# Patient Record
Sex: Male | Born: 1990 | Race: White | Hispanic: No | Marital: Single | Smoking: Current every day smoker
Health system: Southern US, Community
[De-identification: ages and names within clinical notes are randomized; demographics above are authoritative.]

## PROBLEM LIST (undated history)

## (undated) DIAGNOSIS — E785 Hyperlipidemia, unspecified: Secondary | ICD-10-CM

## (undated) DIAGNOSIS — I10 Essential (primary) hypertension: Secondary | ICD-10-CM

## (undated) HISTORY — PX: WISDOM TOOTH EXTRACTION: SHX21

---
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
## 2015-02-08 ENCOUNTER — Emergency Department (HOSPITAL_BASED_OUTPATIENT_CLINIC_OR_DEPARTMENT_OTHER): Payer: BLUE CROSS/BLUE SHIELD

## 2015-02-08 ENCOUNTER — Encounter (HOSPITAL_BASED_OUTPATIENT_CLINIC_OR_DEPARTMENT_OTHER): Payer: Self-pay | Admitting: *Deleted

## 2015-02-08 ENCOUNTER — Emergency Department (HOSPITAL_BASED_OUTPATIENT_CLINIC_OR_DEPARTMENT_OTHER)
Admission: EM | Admit: 2015-02-08 | Discharge: 2015-02-08 | Disposition: A | Discharge status: Home / Self Care | Payer: BLUE CROSS/BLUE SHIELD | Attending: Emergency Medicine | Admitting: Emergency Medicine

## 2015-02-08 DIAGNOSIS — Y9289 Other specified places as the place of occurrence of the external cause: Secondary | ICD-10-CM | POA: Diagnosis not present

## 2015-02-08 DIAGNOSIS — S62610A Displaced fracture of proximal phalanx of right index finger, initial encounter for closed fracture: Secondary | ICD-10-CM | POA: Insufficient documentation

## 2015-02-08 DIAGNOSIS — S62600A Fracture of unspecified phalanx of right index finger, initial encounter for closed fracture: Secondary | ICD-10-CM

## 2015-02-08 DIAGNOSIS — Z720 Tobacco use: Secondary | ICD-10-CM | POA: Insufficient documentation

## 2015-02-08 DIAGNOSIS — Y998 Other external cause status: Secondary | ICD-10-CM | POA: Diagnosis not present

## 2015-02-08 DIAGNOSIS — W228XXA Striking against or struck by other objects, initial encounter: Secondary | ICD-10-CM | POA: Diagnosis not present

## 2015-02-08 DIAGNOSIS — Y9389 Activity, other specified: Secondary | ICD-10-CM | POA: Insufficient documentation

## 2015-02-08 DIAGNOSIS — S6991XA Unspecified injury of right wrist, hand and finger(s), initial encounter: Secondary | ICD-10-CM | POA: Diagnosis present

## 2015-02-08 MED ORDER — HYDROCODONE-ACETAMINOPHEN 5-325 MG PO TABS
2.0000 | ORAL_TABLET | Freq: Once | ORAL | Status: AC
  Administered 2015-02-08: 2 via ORAL
  Filled 2015-02-08: qty 2

## 2015-02-08 MED ORDER — HYDROCODONE-ACETAMINOPHEN 5-325 MG PO TABS: 1.0000 | ORAL_TABLET | Freq: Four times a day (QID) | ORAL | Status: DC | PRN

## 2015-02-08 NOTE — ED Provider Notes (Signed)
CSN: [id number]     Arrival date & time 02/08/15  1307 History   First MD Initiated Contact with Patient 02/08/15 1317     Chief Complaint  Patient presents with  . Hand Pain     (Consider location/radiation/quality/duration/timing/severity/associated sxs/prior Treatment) HPI Complains of pain at right hand predominantly index finger after he punched a sofa last night. No other injury. No treatment prior to coming here. Pain worse with moving his index finger. Improved with remaining still no other associated symptoms. History reviewed. No pertinent past medical history. Past Surgical History  Procedure Laterality Date  . Wisdom tooth extraction     No family history on file. History  Substance Use Topics  . Smoking status: Current Every Day Smoker  . Smokeless tobacco: Not on file  . Alcohol Use: Yes    Review of Systems  Constitutional: Negative.   Musculoskeletal: Positive for arthralgias.       Right hand pain      Allergies  Review of patient's allergies indicates no known allergies.  Home Medications   Prior to Admission medications   Not on File   BP 136/89 mmHg  Pulse 93  Temp(Src) 98.2 F (36.8 C) (Oral)  Resp 16  Ht 6\' 1"  (1.854 m)  Wt 205 lb (92.987 kg)  BMI 27.05 kg/m2  SpO2 100% Physical Exam  Constitutional: He appears well-developed and well-nourished. No distress.  HENT:  Head: Normocephalic and atraumatic.  Right Ear: External ear normal.  Left Ear: External ear normal.  Eyes: EOM are normal.  Neck: Neck supple.  Cardiovascular: Normal rate.   Pulmonary/Chest: Effort normal.  Abdominal: He exhibits no distension.  Musculoskeletal:  Right upper extremity skin intact. Mildly swollen and tender at index finger proximal phalanx. Palmer surface of hand ecchymotic immediatelyproximal2 index finger. Full range of motion. Good capillary refill. All other extremities or contusion abrasion or tenderness neurovascularly intact  Nursing note and  vitals reviewed.   ED Course  Procedures (including critical care time) Labs Review Labs Reviewed - No data to display  Imaging Review Dg Hand Complete Right  02/08/2015   CLINICAL DATA:  Right hand pain and tenderness, trauma yesterday  EXAM: RIGHT HAND - COMPLETE 3+ VIEW  COMPARISON:  None.  FINDINGS: There is a transverse fracture at the base of the right second digit proximal phalanx. No radiopaque foreign body. Soft tissues are unremarkable.  IMPRESSION: Transverse fracture at the base of the proximal phalanx of the right second digit.   Electronically Signed   By: Christiana Pellant M.D.   On: 02/08/2015 13:34     EKG Interpretation None     X-ray viewed by me No results found for this or any previous visit. Dg Hand Complete Right  02/08/2015   CLINICAL DATA:  Right hand pain and tenderness, trauma yesterday  EXAM: RIGHT HAND - COMPLETE 3+ VIEW  COMPARISON:  None.  FINDINGS: There is a transverse fracture at the base of the right second digit proximal phalanx. No radiopaque foreign body. Soft tissues are unremarkable.  IMPRESSION: Transverse fracture at the base of the proximal phalanx of the right second digit.   Electronically Signed   By: Christiana Pellant M.D.   On: 02/08/2015 13:34    Splint applied by nurse and modified by me. Patient comfortable after splint applied and treated with Norco MDM  Plan prescription Norco. Referral Dr. Mina Marble Diagnosis closed fracture of right index finger Final diagnoses:  None        Johnita Palleschi  Ethelda Chick, MD 02/08/15 [phone]

## 2015-02-08 NOTE — Discharge Instructions (Signed)
Cast or Splint Care Take Tylenol or Advil for mild pain or the pain medicine prescribed for bad pain. Don't take Tylenol or drink alcohol together with the pain medicine prescribed as the combination be dangerous.call Dr. Ronie Spies office today or tomorrow to schedule an office appointment for within the next week. Casts and splints support injured limbs and keep bones from moving while they heal.  HOME CARE  Keep the cast or splint uncovered during the drying period.  A plaster cast can take 24 to 48 hours to dry.  A fiberglass cast will dry in less than 1 hour.  Do not rest the cast on anything harder than a pillow for 24 hours.  Do not put weight on your injured limb. Do not put pressure on the cast. Wait for your doctor's approval.  Keep the cast or splint dry.  Cover the cast or splint with a plastic bag during baths or wet weather.  If you have a cast over your chest and belly (trunk), take sponge baths until the cast is taken off.  If your cast gets wet, dry it with a towel or blow dryer. Use the cool setting on the blow dryer.  Keep your cast or splint clean. Wash a dirty cast with a damp cloth.  Do not put any objects under your cast or splint.  Do not scratch the skin under the cast with an object. If itching is a problem, use a blow dryer on a cool setting over the itchy area.  Do not trim or cut your cast.  Do not take out the padding from inside your cast.  Exercise your joints near the cast as told by your doctor.  Raise (elevate) your injured limb on 1 or 2 pillows for the first 1 to 3 days. GET HELP IF:  Your cast or splint cracks.  Your cast or splint is too tight or too loose.  You itch badly under the cast.  Your cast gets wet or has a soft spot.  You have a bad smell coming from the cast.  You get an object stuck under the cast.  Your skin around the cast becomes red or sore.  You have new or more pain after the cast is put on. GET HELP RIGHT  AWAY IF:  You have fluid leaking through the cast.  You cannot move your fingers or toes.  Your fingers or toes turn blue or white or are cool, painful, or puffy (swollen).  You have tingling or lose feeling (numbness) around the injured area.  You have bad pain or pressure under the cast.  You have trouble breathing or have shortness of breath.  You have chest pain. Document Released: 11/02/2010 Document Revised: 03/05/2013 Document Reviewed: 01/09/2013 [facility] Patient Information 2015 [place], [place]. This information is not intended to replace advice given to you by your health care provider. Make sure you discuss any questions you have with your health care provider.

## 2015-02-08 NOTE — ED Notes (Signed)
"  punched" piece of furniture, wooden support portion, incident occurred last PM, pain indicated at Right Index finger, swelling noted to rt hand area also noted

## 2015-02-08 NOTE — ED Notes (Signed)
Pt punched a couch last night with his right hand. Hand is now swollen and painful.

## 2015-02-08 NOTE — ED Notes (Signed)
Ice pack and elevation implemented for comfort measures 

## 2016-06-12 DIAGNOSIS — B2790 Infectious mononucleosis, unspecified without complication: Secondary | ICD-10-CM | POA: Diagnosis not present

## 2016-06-12 DIAGNOSIS — J029 Acute pharyngitis, unspecified: Secondary | ICD-10-CM | POA: Diagnosis not present

## 2016-11-16 DIAGNOSIS — J029 Acute pharyngitis, unspecified: Secondary | ICD-10-CM | POA: Diagnosis not present

## 2017-03-14 DIAGNOSIS — Z713 Dietary counseling and surveillance: Secondary | ICD-10-CM | POA: Diagnosis not present

## 2018-09-24 ENCOUNTER — Encounter (HOSPITAL_BASED_OUTPATIENT_CLINIC_OR_DEPARTMENT_OTHER): Payer: Self-pay | Admitting: *Deleted

## 2018-09-24 ENCOUNTER — Other Ambulatory Visit: Payer: Self-pay

## 2018-09-24 ENCOUNTER — Emergency Department (HOSPITAL_BASED_OUTPATIENT_CLINIC_OR_DEPARTMENT_OTHER)
Admission: EM | Admit: 2018-09-24 | Discharge: 2018-09-25 | Disposition: A | Discharge status: Home / Self Care | Payer: BLUE CROSS/BLUE SHIELD | Attending: Emergency Medicine | Admitting: Emergency Medicine

## 2018-09-24 DIAGNOSIS — F17200 Nicotine dependence, unspecified, uncomplicated: Secondary | ICD-10-CM | POA: Diagnosis not present

## 2018-09-24 DIAGNOSIS — B9789 Other viral agents as the cause of diseases classified elsewhere: Secondary | ICD-10-CM

## 2018-09-24 DIAGNOSIS — R05 Cough: Secondary | ICD-10-CM | POA: Diagnosis not present

## 2018-09-24 DIAGNOSIS — R509 Fever, unspecified: Secondary | ICD-10-CM | POA: Diagnosis present

## 2018-09-24 DIAGNOSIS — J069 Acute upper respiratory infection, unspecified: Secondary | ICD-10-CM | POA: Insufficient documentation

## 2018-09-24 NOTE — ED Notes (Signed)
ED Provider at bedside. 

## 2018-09-24 NOTE — ED Triage Notes (Signed)
Cough and fever x 2 days. No fever reducer.

## 2018-09-24 NOTE — ED Provider Notes (Signed)
   [facility] Provider Note: Roy Dell, MD, FACEP  CSN: [id number] MRN: [id number] ARRIVAL: 09/24/18 at 2205 ROOM: [facility]2/[facility]2   CHIEF COMPLAINT  Fever and Cough   HISTORY OF PRESENT ILLNESS  09/24/18 11:47 PM Anatoly Dory is a 28 y.o. male with a 2-day history of flulike symptoms.  He has had subjective fever, body aches, nasal congestion, scratchy throat, nonproductive cough and equivocal shortness of breath. He has been taking DayQuil, which was helping his symptoms, but is now out.    History reviewed. No pertinent past medical history.  Past Surgical History:  Procedure Laterality Date  . WISDOM TOOTH EXTRACTION      No family history on file.  Social History   Tobacco Use  . Smoking status: Current Every Day Smoker  . Smokeless tobacco: Never Used  Substance Use Topics  . Alcohol use: Yes  . Drug use: No    Prior to Admission medications   Medication Sig Start Date End Date Taking? Authorizing Provider  HYDROcodone-acetaminophen (NORCO) 5-325 MG per tablet Take 1-2 tablets by mouth every 6 (six) hours as needed. 02/08/15   Doug Sou, MD    Allergies Patient has no known allergies.   REVIEW OF SYSTEMS  Negative except as noted here or in the History of Present Illness.   PHYSICAL EXAMINATION  Initial Vital Signs Blood pressure 137/90, pulse 92, temperature 98.3 F (36.8 C), temperature source Oral, resp. rate 20, height 6\' 1"  (1.854 m), weight 104.3 kg, SpO2 100 %.  Examination General: Well-developed, well-nourished male in no acute distress; appearance consistent with age of record HENT: normocephalic; atraumatic; pharyngeal erythema without exudate; nasal congestion Eyes: pupils equal, round and reactive to light; extraocular muscles intact Neck: supple Heart: regular rate and rhythm Lungs: clear to auscultation bilaterally Abdomen: soft; nondistended; nontender; bowel sounds present Extremities: No deformity; full range of  motion; pulses normal Neurologic: Awake, alert and oriented; motor function intact in all extremities and symmetric; no facial droop Skin: Warm and dry Psychiatric: Normal mood and affect   RESULTS  Summary of this visit's results, reviewed by myself:   EKG Interpretation  Date/Time:    Ventricular Rate:    PR Interval:    QRS Duration:   QT Interval:    QTC Calculation:   R Axis:     Text Interpretation:        Laboratory Studies: No results found for this or any previous visit (from the past 24 hour(s)). Imaging Studies: No results found.  ED COURSE and MDM  Nursing notes and initial vitals signs, including pulse oximetry, reviewed.  Vitals:   09/24/18 2208 09/24/18 2211  BP:  137/90  Pulse:  92  Resp:  20  Temp:  98.3 F (36.8 C)  TempSrc:  Oral  SpO2:  100%  Weight: 104.3 kg   Height: 6\' 1"  (1.854 m)     PROCEDURES    ED DIAGNOSES     ICD-10-CM   1. Viral URI with cough J06.9    B97.89        Tarisha Fader, MD 09/24/18 2358

## 2018-09-24 NOTE — ED Notes (Signed)
Pt reports body aches/fevers/flu symptoms x 2 days

## 2018-09-25 MED ORDER — DM-GUAIFENESIN ER 60-1200 MG PO TB12: ORAL_TABLET | ORAL | 0 refills | Status: AC

## 2018-09-25 MED ORDER — ALBUTEROL SULFATE HFA 108 (90 BASE) MCG/ACT IN AERS: 2.0000 | INHALATION_SPRAY | RESPIRATORY_TRACT | 0 refills | Status: AC | PRN

## 2018-10-09 ENCOUNTER — Emergency Department (HOSPITAL_BASED_OUTPATIENT_CLINIC_OR_DEPARTMENT_OTHER): Payer: BLUE CROSS/BLUE SHIELD

## 2018-10-09 ENCOUNTER — Other Ambulatory Visit: Payer: Self-pay

## 2018-10-09 ENCOUNTER — Emergency Department (HOSPITAL_BASED_OUTPATIENT_CLINIC_OR_DEPARTMENT_OTHER)
Admission: EM | Admit: 2018-10-09 | Discharge: 2018-10-09 | Disposition: A | Discharge status: Home / Self Care | Payer: BLUE CROSS/BLUE SHIELD | Attending: Emergency Medicine | Admitting: Emergency Medicine

## 2018-10-09 ENCOUNTER — Encounter (HOSPITAL_BASED_OUTPATIENT_CLINIC_OR_DEPARTMENT_OTHER): Payer: Self-pay | Admitting: *Deleted

## 2018-10-09 DIAGNOSIS — F17200 Nicotine dependence, unspecified, uncomplicated: Secondary | ICD-10-CM | POA: Diagnosis not present

## 2018-10-09 DIAGNOSIS — R05 Cough: Secondary | ICD-10-CM | POA: Insufficient documentation

## 2018-10-09 DIAGNOSIS — R059 Cough, unspecified: Secondary | ICD-10-CM

## 2018-10-09 MED ORDER — BENZONATATE 200 MG PO CAPS: 200.0000 mg | ORAL_CAPSULE | Freq: Three times a day (TID) | ORAL | 0 refills | Status: AC | PRN

## 2018-10-09 MED ORDER — PREDNISONE 20 MG PO TABS: 60.0000 mg | ORAL_TABLET | Freq: Every day | ORAL | 0 refills | Status: AC

## 2018-10-09 NOTE — ED Provider Notes (Signed)
[facility] EMERGENCY DEPARTMENT Provider Note   CSN: [id number] Arrival date & time: 10/09/18  1620  History   Chief Complaint Chief Complaint  Patient presents with   Cough    HPI Roy Phillips is a 28 y.o. male with no significant past medical history who presents for evaluation of cough.  Patient states he has had cough x310.  Patient was seen in ED at that time was diagnosed with possible flulike illness.  Patient states majority of his symptoms have resolved however he still had persistent cough since his illness.  Has been using as needed albuterol inhaler as well as NyQuil with mild relief of symptoms.  Patient is concerned that he has pneumonia as he has continued cough.  Denies fever, chills, nasal congestion, rhinorrhea, sore throat, neck pain, neck stiffness, chest pain, shortness of breath, night sweats, hemoptysis, nausea, vomiting, abdominal pain, diarrhea dysuria, lower extremity swelling, erythema or warmth.  Denies any additional aggravating or alleviating factors.  Cough mildly productive with light yellow sputum.  Denies PND or orthopnea. Patient is a tobacco smoker.  History obtained from patient.  No interpreter was used.    HPI  History reviewed. No pertinent past medical history.  There are no active problems to display for this patient.   Past Surgical History:  Procedure Laterality Date   WISDOM TOOTH EXTRACTION          Home Medications    Prior to Admission medications   Medication Sig Start Date End Date Taking? Authorizing Provider  albuterol (PROVENTIL HFA;VENTOLIN HFA) 108 (90 Base) MCG/ACT inhaler Inhale 2 puffs into the lungs every 4 (four) hours as needed for wheezing or shortness of breath. 09/24/18   Molpus, Jonny Ruiz, MD  benzonatate (TESSALON) 200 MG capsule Take 1 capsule (200 mg total) by mouth 3 (three) times daily as needed for up to 7 days for cough. 10/09/18 10/16/18  Sylvio Weatherall A, PA-C  Dextromethorphan-Guaifenesin  60-1200 MG 12hr tablet Take 1 tablet twice daily as needed for cough. 09/24/18   Molpus, John, MD  predniSONE (DELTASONE) 20 MG tablet Take 3 tablets (60 mg total) by mouth daily for 5 days. 10/09/18 10/14/18  Jheri Mitter A, PA-C    Family History History reviewed. No pertinent family history.  Social History Social History   Tobacco Use   Smoking status: Current Every Day Smoker   Smokeless tobacco: Never Used  Substance Use Topics   Alcohol use: Yes   Drug use: No     Allergies   Patient has no known allergies.   Review of Systems Review of Systems  Constitutional: Negative.   HENT: Negative.   Eyes: Negative.   Respiratory: Positive for cough. Negative for apnea, choking, chest tightness, shortness of breath, wheezing and stridor.   Cardiovascular: Negative.   Gastrointestinal: Negative.   Genitourinary: Negative.   Musculoskeletal: Negative.   Skin: Negative.   Neurological: Negative.   All other systems reviewed and are negative.    Physical Exam Updated Vital Signs BP (!) 158/102    Pulse 98    Temp 98.6 F (37 C) (Oral)    Resp 18    Ht  (1.854 m)    Wt 104 kg    SpO2 99%    BMI 30.25 kg/m   Physical Exam Vitals signs and nursing note reviewed.  Constitutional:      General: He is not in acute distress.    Appearance: He is not ill-appearing, toxic-appearing or diaphoretic.  HENT:  Head: Normocephalic and atraumatic.     Jaw: There is normal jaw occlusion.     Right Ear: Tympanic membrane, ear canal and external ear normal. There is no impacted cerumen. No hemotympanum. Tympanic membrane is not injected, scarred, perforated, erythematous, retracted or bulging.     Left Ear: Tympanic membrane, ear canal and external ear normal. There is no impacted cerumen. No hemotympanum. Tympanic membrane is not injected, scarred, perforated, erythematous, retracted or bulging.     Ears:     Comments: No Mastoid tenderness.    Nose: Rhinorrhea present.  No congestion.     Comments: Clear rhinorrhea and congestion to bilateral nares.  No sinus tenderness.    Mouth/Throat:     Mouth: Mucous membranes are moist.     Pharynx: Oropharynx is clear.     Comments: Posterior oropharynx clear.  Mucous membranes moist.  Tonsils without erythema or exudate.  Uvula midline without deviation.  No evidence of PTA or RPA.  No drooling, dysphasia or trismus.  Phonation normal. Neck:     Trachea: Trachea and phonation normal.     Meningeal: Brudzinski's sign and Kernig's sign absent.     Comments: No Neck stiffness or neck rigidity.  No meningismus.  No cervical lymphadenopathy. Cardiovascular:     Comments: No murmurs rubs or gallops. Pulmonary:     Effort: Pulmonary effort is normal. No respiratory distress.     Breath sounds: Normal breath sounds. No stridor. No wheezing, rhonchi or rales.     Comments: Clear to auscultation bilaterally without wheeze, rhonchi or rales.  No accessory muscle usage.  Able speak in full sentences. Chest:     Chest wall: No tenderness.  Abdominal:     Comments: Soft, nontender without rebound or guarding.  No CVA tenderness.  Musculoskeletal:     Comments: Moves all 4 extremities without difficulty.  Lower extremities without edema, erythema or warmth.  Skin:    Comments: Brisk capillary refill.  No rashes or lesions.  No lower extremity edema, erythema or warmth.  Neurological:     Mental Status: He is alert.     Comments: Ambulatory in department without difficulty.  Cranial nerves II through XII grossly intact.  No facial droop.  No aphasia.    ED Treatments / Results  Labs (all labs ordered are listed, but only abnormal results are displayed) Labs Reviewed - No data to display  EKG None  Radiology Dg Chest 2 View  Result Date: 10/09/2018 CLINICAL DATA:  Persistent cough. EXAM: CHEST - 2 VIEW COMPARISON:  None. FINDINGS: The heart size and mediastinal contours are within normal limits. Both lungs are clear.  Bilateral nipple shadows. The visualized skeletal structures are unremarkable. IMPRESSION: No active cardiopulmonary disease. Electronically Signed   By: Obie Dredge M.D.   On: 10/09/2018 16:44    Procedures Procedures (including critical care time)  Medications Ordered in ED Medications - No data to display   Initial Impression / Assessment and Plan / ED Course  I have reviewed the triage vital signs and the nursing notes.  Pertinent labs & imaging results that were available during my care of the patient were reviewed by me and considered in my medical decision making (see chart for details).  28 year old male who appears otherwise well presents for evaluation of cough.  Afebrile, nonseptic, non-ill-appearing.  Patient with cough 2 weeks after being diagnosed with flulike symptoms.  Cough mildly productive with light yellow sputum.  Lungs clear to auscultation bilateral without  wheeze, rhonchi or rales.  Able speak in full sentences without difficulty.  No tachypnea, tachycardia or hypoxia.  Chest x-ray personally reviewed.  No evidence of infiltrates, cardiomegaly, pulmonary edema or pneumothorax.  Posterior oropharynx clear.  Able to tolerate p.o. intake without difficulty.  No evidence of PTA or RPA.  Uvula midline deviation.  No neck stiffness or neck rigidity.  No meningismus, low suspicion for meningitis.  Patient states he has not had fever, body aches and pains x2 weeks.  Patient without travel, recent exposure to coronavirus positive patients or TB patients.  Patient without chest pain, shortness of breath, hemoptysis, lower extremity edema, erythema or warmth.  Low suspicion for ACS or PE as cause of cough.  He has no risk factors for TB.  Patient is an everyday tobacco smoker.  Likely post viral cough vs cough from tobacco use.  Will give short course of prednisone, inhaler, Tessalon Perles.  Discussed with patient following up with PCP for reevaluation.  Low suspicion for acute  bacterial illness, PE, pneumothorax, dissection, pulmonary mass, asthma, pneumonia, pertussis, seasonal allergies, CHF, medication induced cough at this time.  Pt will be discharged with symptomatic treatment.  Patient verbalizes understanding and is agreeable with plan. Pt is hemodynamically stable & in NAD prior to dc.     Final Clinical Impressions(s) / ED Diagnoses   Final diagnoses:  Cough    ED Discharge Orders         Ordered    benzonatate (TESSALON) 200 MG capsule  3 times daily PRN     10/09/18 1705    predniSONE (DELTASONE) 20 MG tablet  Daily     10/09/18 1705           Lynda Capistran A, PA-C 10/09/18 1722    Tegeler, Canary Brim, MD 10/09/18 1725

## 2018-10-09 NOTE — ED Notes (Signed)
Patient transported to X-ray 

## 2018-10-09 NOTE — ED Triage Notes (Signed)
Pt c/o cough since 3/10 as was seen here for same , no improvement

## 2018-10-09 NOTE — Discharge Instructions (Signed)
Your evaluated today for cough.  Your chest x-ray was negative.  I have given you a short course of steroids as well as some Tessalon Perles.  Please take as prescribed.  Up with PCP for reevaluation if you continue to have symptoms.  Return to the ED for any worsening symptoms

## 2018-10-09 NOTE — ED Notes (Signed)
ED Provider at bedside. 

## 2018-11-01 ENCOUNTER — Emergency Department (HOSPITAL_BASED_OUTPATIENT_CLINIC_OR_DEPARTMENT_OTHER): Payer: BLUE CROSS/BLUE SHIELD

## 2018-11-01 ENCOUNTER — Encounter (HOSPITAL_BASED_OUTPATIENT_CLINIC_OR_DEPARTMENT_OTHER): Payer: Self-pay

## 2018-11-01 ENCOUNTER — Emergency Department (HOSPITAL_BASED_OUTPATIENT_CLINIC_OR_DEPARTMENT_OTHER)
Admission: EM | Admit: 2018-11-01 | Discharge: 2018-11-01 | Disposition: A | Discharge status: Home / Self Care | Payer: BLUE CROSS/BLUE SHIELD | Attending: Emergency Medicine | Admitting: Emergency Medicine

## 2018-11-01 ENCOUNTER — Other Ambulatory Visit: Payer: Self-pay

## 2018-11-01 DIAGNOSIS — L02215 Cutaneous abscess of perineum: Secondary | ICD-10-CM | POA: Diagnosis not present

## 2018-11-01 DIAGNOSIS — K610 Anal abscess: Secondary | ICD-10-CM | POA: Insufficient documentation

## 2018-11-01 DIAGNOSIS — F17210 Nicotine dependence, cigarettes, uncomplicated: Secondary | ICD-10-CM | POA: Insufficient documentation

## 2018-11-01 DIAGNOSIS — R102 Pelvic and perineal pain: Secondary | ICD-10-CM | POA: Diagnosis not present

## 2018-11-01 DIAGNOSIS — R6883 Chills (without fever): Secondary | ICD-10-CM | POA: Diagnosis not present

## 2018-11-01 DIAGNOSIS — L0291 Cutaneous abscess, unspecified: Secondary | ICD-10-CM

## 2018-11-01 LAB — CBC WITH DIFFERENTIAL/PLATELET
Abs Immature Granulocytes: 0.04 10*3/uL (ref 0.00–0.07)
Basophils Absolute: 0 10*3/uL (ref 0.0–0.1)
Basophils Relative: 0 %
Eosinophils Absolute: 0.2 10*3/uL (ref 0.0–0.5)
Eosinophils Relative: 1 %
HCT: 45.6 % (ref 39.0–52.0)
Hemoglobin: 15.9 g/dL (ref 13.0–17.0)
Immature Granulocytes: 0 %
Lymphocytes Relative: 23 %
Lymphs Abs: 3 10*3/uL (ref 0.7–4.0)
MCH: 30.5 pg (ref 26.0–34.0)
MCHC: 34.9 g/dL (ref 30.0–36.0)
MCV: 87.5 fL (ref 80.0–100.0)
Monocytes Absolute: 1.1 10*3/uL — ABNORMAL HIGH (ref 0.1–1.0)
Monocytes Relative: 8 %
Neutro Abs: 8.9 10*3/uL — ABNORMAL HIGH (ref 1.7–7.7)
Neutrophils Relative %: 68 %
Platelets: 279 10*3/uL (ref 150–400)
RBC: 5.21 MIL/uL (ref 4.22–5.81)
RDW: 12.3 % (ref 11.5–15.5)
WBC: 13.2 10*3/uL — ABNORMAL HIGH (ref 4.0–10.5)
nRBC: 0 % (ref 0.0–0.2)

## 2018-11-01 LAB — LACTIC ACID, PLASMA: Lactic Acid, Venous: 0.9 mmol/L (ref 0.5–1.9)

## 2018-11-01 LAB — COMPREHENSIVE METABOLIC PANEL
ALT: 27 U/L (ref 0–44)
AST: 20 U/L (ref 15–41)
Albumin: 4.1 g/dL (ref 3.5–5.0)
Alkaline Phosphatase: 101 U/L (ref 38–126)
Anion gap: 8 (ref 5–15)
BUN: 14 mg/dL (ref 6–20)
CO2: 23 mmol/L (ref 22–32)
Calcium: 8.7 mg/dL — ABNORMAL LOW (ref 8.9–10.3)
Chloride: 103 mmol/L (ref 98–111)
Creatinine, Ser: 1.29 mg/dL — ABNORMAL HIGH (ref 0.61–1.24)
GFR calc Af Amer: >60 mL/min (ref >60)
GFR calc non Af Amer: >60 mL/min (ref >60)
Glucose, Bld: 109 mg/dL — ABNORMAL HIGH (ref 70–99)
Potassium: 3.4 mmol/L — ABNORMAL LOW (ref 3.5–5.1)
Sodium: 134 mmol/L — ABNORMAL LOW (ref 135–145)
Total Bilirubin: 1.2 mg/dL (ref 0.3–1.2)
Total Protein: 7.9 g/dL (ref 6.5–8.1)

## 2018-11-01 LAB — URINALYSIS, ROUTINE W REFLEX MICROSCOPIC
Bilirubin Urine: NEGATIVE
Glucose, UA: NEGATIVE mg/dL
Hgb urine dipstick: NEGATIVE
Ketones, ur: NEGATIVE mg/dL
Leukocytes,Ua: NEGATIVE
Nitrite: NEGATIVE
Protein, ur: NEGATIVE mg/dL
Specific Gravity, Urine: 1.015 (ref 1.005–1.030)
pH: 7.5 (ref 5.0–8.0)

## 2018-11-01 MED ORDER — LIDOCAINE-EPINEPHRINE (PF) 2 %-1:200000 IJ SOLN
20.0000 mL | Freq: Once | INTRAMUSCULAR | Status: DC
  Filled 2018-11-01: qty 20

## 2018-11-01 MED ORDER — IOHEXOL 300 MG/ML  SOLN
100.0000 mL | Freq: Once | INTRAMUSCULAR | Status: AC | PRN
  Administered 2018-11-01: 100 mL via INTRAVENOUS

## 2018-11-01 MED ORDER — IBUPROFEN 800 MG PO TABS: 800.0000 mg | ORAL_TABLET | Freq: Three times a day (TID) | ORAL | 0 refills | Status: AC

## 2018-11-01 MED ORDER — SODIUM CHLORIDE 0.9 % IV BOLUS
1000.0000 mL | Freq: Once | INTRAVENOUS | Status: AC
  Administered 2018-11-01: 1000 mL via INTRAVENOUS

## 2018-11-01 MED ORDER — MORPHINE SULFATE (PF) 4 MG/ML IV SOLN
4.0000 mg | Freq: Once | INTRAVENOUS | Status: AC
  Administered 2018-11-01: 4 mg via INTRAVENOUS
  Filled 2018-11-01: qty 1

## 2018-11-01 MED ORDER — CEPHALEXIN 500 MG PO CAPS: 500.0000 mg | ORAL_CAPSULE | Freq: Four times a day (QID) | ORAL | 0 refills | Status: AC

## 2018-11-01 MED ORDER — SULFAMETHOXAZOLE-TRIMETHOPRIM 800-160 MG PO TABS: 1.0000 | ORAL_TABLET | Freq: Two times a day (BID) | ORAL | 0 refills | Status: AC

## 2018-11-01 MED ORDER — LIDOCAINE HCL 2 % IJ SOLN: 10.0000 mL | Freq: Once | INTRAMUSCULAR | Status: DC

## 2018-11-01 MED ORDER — ONDANSETRON HCL 4 MG/2ML IJ SOLN
INTRAMUSCULAR | Status: AC
  Filled 2018-11-01: qty 2

## 2018-11-01 MED ORDER — LIDOCAINE HCL 2 % IJ SOLN
20.0000 mL | Freq: Once | INTRAMUSCULAR | Status: DC
  Filled 2018-11-01: qty 20

## 2018-11-01 MED ORDER — TETANUS-DIPHTH-ACELL PERTUSSIS 5-2.5-18.5 LF-MCG/0.5 IM SUSP
0.5000 mL | Freq: Once | INTRAMUSCULAR | Status: AC
  Administered 2018-11-01: 22:00:00 0.5 mL via INTRAMUSCULAR
  Filled 2018-11-01: qty 0.5

## 2018-11-01 MED ORDER — HYDROCODONE-ACETAMINOPHEN 5-325 MG PO TABS: 1.0000 | ORAL_TABLET | Freq: Four times a day (QID) | ORAL | 0 refills | Status: AC | PRN

## 2018-11-01 MED ORDER — PIPERACILLIN-TAZOBACTAM 3.375 G IVPB 30 MIN
3.3750 g | Freq: Once | INTRAVENOUS | Status: AC
  Administered 2018-11-01: 3.375 g via INTRAVENOUS
  Filled 2018-11-01 (×2): qty 50

## 2018-11-01 MED ORDER — LIDOCAINE HCL 2 % IJ SOLN
INTRAMUSCULAR | Status: AC
  Filled 2018-11-01: qty 20

## 2018-11-01 MED ORDER — IBUPROFEN 800 MG PO TABS
800.0000 mg | ORAL_TABLET | Freq: Once | ORAL | Status: AC
  Administered 2018-11-01: 21:00:00 800 mg via ORAL
  Filled 2018-11-01: qty 1

## 2018-11-01 MED ORDER — ONDANSETRON HCL 4 MG/2ML IJ SOLN
4.0000 mg | Freq: Once | INTRAMUSCULAR | Status: AC
  Administered 2018-11-01: 4 mg via INTRAVENOUS

## 2018-11-01 MED ORDER — SODIUM CHLORIDE 0.9% FLUSH
3.0000 mL | Freq: Once | INTRAVENOUS | Status: DC
  Filled 2018-11-01: qty 3

## 2018-11-01 NOTE — ED Provider Notes (Signed)
[facility] EMERGENCY DEPARTMENT Provider Note   CSN: [id number] Arrival date & time: 11/01/18  2012    History   Chief Complaint Chief Complaint  Patient presents with  . Abscess    HPI Roy Phillips is a 28 y.o. male.     The history is provided by the patient and medical records. No language interpreter was used.  Abscess  Associated symptoms: fever    Roy Phillips is a 28 y.o. male  with no known medical problems who presents to the Emergency Department complaining of an area of discomfort to the left side of his buttocks for about 4 days now.  It has been getting progressively worse.  Last night, he developed chills and was unable to sit on the area due to pain.  He did not check his temperature yesterday, but felt as if he had a fever.  Continued to feel worse during the day today.  When he got home from work, he took some NyQuil which contained acetaminophen as he thought he had a high fever.  He then came to the emergency department for further evaluation.  He denies any abdominal pain, nausea or vomiting.  He denies any drainage from the site of concern.  No cough, congestion, sore throat, rhinorrhea/nasal congestion.  No chest pain or shortness of breath.  Denies any history of similar.  History reviewed. No pertinent past medical history.  There are no active problems to display for this patient.   Past Surgical History:  Procedure Laterality Date  . WISDOM TOOTH EXTRACTION          Home Medications    Prior to Admission medications   Medication Sig Start Date End Date Taking? Authorizing Provider  albuterol (PROVENTIL HFA;VENTOLIN HFA) 108 (90 Base) MCG/ACT inhaler Inhale 2 puffs into the lungs every 4 (four) hours as needed for wheezing or shortness of breath. 09/24/18   Molpus, John, MD  cephALEXin (KEFLEX) 500 MG capsule Take 1 capsule (500 mg total) by mouth 4 (four) times daily. 11/01/18   , Chase Picket, PA-C   Dextromethorphan-Guaifenesin 60-1200 MG 12hr tablet Take 1 tablet twice daily as needed for cough. 09/24/18   Molpus, John, MD  HYDROcodone-acetaminophen (NORCO/VICODIN) 5-325 MG tablet Take 1 tablet by mouth every 6 (six) hours as needed for severe pain. 11/01/18   , Chase Picket, PA-C  ibuprofen (ADVIL) 800 MG tablet Take 1 tablet (800 mg total) by mouth 3 (three) times daily. 11/01/18   , Chase Picket, PA-C  sulfamethoxazole-trimethoprim (BACTRIM DS) 800-160 MG tablet Take 1 tablet by mouth 2 (two) times daily for 7 days. 11/01/18 11/08/18  , Chase Picket, PA-C    Family History No family history on file.  Social History Social History   Tobacco Use  . Smoking status: Current Every Day Smoker  . Smokeless tobacco: Never Used  Substance Use Topics  . Alcohol use: Yes  . Drug use: Yes    Types: Marijuana, Cocaine    Comment: last use 10/31/18     Allergies   Patient has no known allergies.   Review of Systems Review of Systems  Constitutional: Positive for chills and fever.  Musculoskeletal: Positive for myalgias (buttocks).  Skin: Positive for wound (Induration, pain).  All other systems reviewed and are negative.    Physical Exam Updated Vital Signs BP 132/78 (BP Location: Left Arm)   Pulse 88   Temp 99.9 F (37.7 C) (Oral)   Resp 20   Ht  (  1.854 m)   Wt 104.3 kg   SpO2 100%   BMI 30.34 kg/m   Physical Exam Vitals signs and nursing note reviewed.  Constitutional:      General: He is not in acute distress.    Appearance: He is well-developed.  HENT:     Head: Normocephalic and atraumatic.  Neck:     Musculoskeletal: Neck supple.  Cardiovascular:     Heart sounds: Normal heart sounds. No murmur.     Comments: Tachycardic but regular. Pulmonary:     Effort: Pulmonary effort is normal. No respiratory distress.     Breath sounds: Normal breath sounds.  Abdominal:     General: There is no distension.     Palpations: Abdomen is soft.      Comments: No abdominal tenderness.  Genitourinary:    Comments: Induration and tenderness to left gluteal cleft and anorectal region. No fluctuance appreciated. No drainage or open wounds. Skin:    General: Skin is warm and dry.  Neurological:     Mental Status: He is alert and oriented to person, place, and time.      ED Treatments / Results  Labs (all labs ordered are listed, but only abnormal results are displayed) Labs Reviewed  COMPREHENSIVE METABOLIC PANEL - Abnormal; Notable for the following components:      Result Value   Sodium 134 (*)    Potassium 3.4 (*)    Glucose, Bld 109 (*)    Creatinine, Ser 1.29 (*)    Calcium 8.7 (*)    All other components within normal limits  CBC WITH DIFFERENTIAL/PLATELET - Abnormal; Notable for the following components:   WBC 13.2 (*)    Neutro Abs 8.9 (*)    Monocytes Absolute 1.1 (*)    All other components within normal limits  CULTURE, BLOOD (ROUTINE X 2)  CULTURE, BLOOD (ROUTINE X 2)  AEROBIC CULTURE (SUPERFICIAL SPECIMEN)  LACTIC ACID, PLASMA  URINALYSIS, ROUTINE W REFLEX MICROSCOPIC  LACTIC ACID, PLASMA    EKG None  Radiology Dg Chest 2 View  Result Date: 11/01/2018 CLINICAL DATA:  Chills, fever EXAM: CHEST - 2 VIEW COMPARISON:  10/09/2018 FINDINGS: The heart size and mediastinal contours are within normal limits. Both lungs are clear. The visualized skeletal structures are unremarkable. IMPRESSION: No active cardiopulmonary disease. Electronically Signed   By: Elige Ko   On: 11/01/2018 21:48   Ct Pelvis W Contrast  Result Date: 11/01/2018 CLINICAL DATA:  Left buttock abscess.  Pain and fever. EXAM: CT PELVIS WITH CONTRAST TECHNIQUE: Multidetector CT imaging of the pelvis was performed using the standard protocol following the bolus administration of intravenous contrast. CONTRAST:  OMNIPAQUE IOHEXOL 300 MG/ML  SOLN COMPARISON:  None. FINDINGS: Urinary Tract:  No abnormality visualized. Bowel:  Unremarkable  visualized pelvic bowel loops. Vascular/Lymphatic: No pathologically enlarged lymph nodes. No significant vascular abnormality seen. Reproductive:  No mass or other significant abnormality Other: 3.4 x 1.8 x 3.4 cm complex fluid collection in the subcutaneous fat in the left aspect of the perineum adjacent to the anus with surrounding inflammatory changes most consistent with an abscess. Musculoskeletal: No acute osseous abnormality. No aggressive osseous lesion. IMPRESSION: 1. 3.4 x 1.8 x 3.4 cm complex fluid collection in the subcutaneous fat in the left aspect of the perineum adjacent to the anus with surrounding inflammatory changes most consistent with an abscess. Electronically Signed   By: Elige Ko   On: 11/01/2018 21:48    Procedures .Marland KitchenIncision and Drainage Date/Time: 11/01/2018 11:04  PM Performed by: , Chase PicketJaime Pilcher, PA-C Authorized by: , Chase PicketJaime Pilcher, PA-C   Consent:    Consent obtained:  Verbal   Consent given by:  Patient   Risks discussed:  Bleeding, incomplete drainage, pain and infection Location:    Type:  Abscess   Size:  3x2x3   Location:  Anogenital   Anogenital location:  Perineum Pre-procedure details:    Skin preparation:  Betadine Anesthesia (see MAR for exact dosages):    Anesthesia method:  Local infiltration   Local anesthetic:  Lidocaine 2% w/o epi Procedure type:    Complexity:  Complex Procedure details:    Incision types:  Cruciate   Incision depth:  Subcutaneous   Scalpel blade:  11   Wound management:  Probed and deloculated   Drainage:  Bloody and purulent   Drainage amount:  Copious   Wound treatment:  Wound left open   Packing materials:  1/4 in gauze Post-procedure details:    Patient tolerance of procedure:  Tolerated well, no immediate complications   (including critical care time)  Medications Ordered in ED Medications  sodium chloride flush (NS) 0.9 % injection 3 mL (3 mLs Intravenous Not Given 11/01/18 2151)  lidocaine  (XYLOCAINE) 2 % (with pres) injection 200 mg (has no administration in time range)  ibuprofen (ADVIL) tablet 800 mg (800 mg Oral Given 11/01/18 2044)  morphine 4 MG/ML injection 4 mg (4 mg Intravenous Given 11/01/18 2056)  ondansetron (ZOFRAN) injection 4 mg (4 mg Intravenous Given 11/01/18 2104)  sodium chloride 0.9 % bolus 1,000 mL ( Intravenous Stopped 11/01/18 2248)  iohexol (OMNIPAQUE) 300 MG/ML solution 100 mL (100 mLs Intravenous Contrast Given 11/01/18 2117)  Tdap (BOOSTRIX) injection 0.5 mL (0.5 mLs Intramuscular Given 11/01/18 2224)  piperacillin-tazobactam (ZOSYN) IVPB 3.375 g ( Intravenous Stopped 11/01/18 2248)     Initial Impression / Assessment and Plan / ED Course  I have reviewed the triage vital signs and the nursing notes.  Pertinent labs & imaging results that were available during my care of the patient were reviewed by me and considered in my medical decision making (see chart for details).       Sheliah Mendsaron T Edmundson is a 28 y.o. male who presents to ED for wound to left buttocks. Initially febrile and mildly tachycardic which improved with antipyretics. Labs reviewed: leukocytosis of 13.2; normal lactic. Blood pressure fine. Blood cx's obtained, but given normal lactic and BP's, doubt sepsis. CT of the pelvis shows 3.4 x 1.8 x 3.4 cm abscess in the left aspect of the perineum adjacent to the anus. I consulted generally surgery, Dr. Luisa Hartornett, and discussed case. He has also independently reviewed imaging. He recommends bedside I&D in the ER with packing, start on ABX and follow up in 1 week in office. I&D was performed by myself with the assistance and guidance of Dr. Silverio LayYao. Packing placed. Recommended that he come back to ED on Sunday for recheck given extensive nature of abscess with fever today. Very low threshold to return sooner if worsens. Will start on Bactrim / Keflex. Symptomatic home care instructions / follow up care / return precautions discussed just prior to discharge and all  questions answered.   Patient seen by and discussed with Dr. Silverio LayYao who agrees with treatment plan.   Final Clinical Impressions(s) / ED Diagnoses   Final diagnoses:  Abscess    ED Discharge Orders         Ordered    sulfamethoxazole-trimethoprim (BACTRIM DS) 800-160 MG tablet  2 times daily     11/01/18 2257    cephALEXin (KEFLEX) 500 MG capsule  4 times daily     11/01/18 2257    HYDROcodone-acetaminophen (NORCO/VICODIN) 5-325 MG tablet  Every 6 hours PRN     11/01/18 2257    ibuprofen (ADVIL) 800 MG tablet  3 times daily     11/01/18 2257           , Chase Picket, PA-C 11/01/18 2318    Charlynne Pander, MD 11/02/18 [phone]

## 2018-11-01 NOTE — ED Notes (Signed)
ED Provider at bedside. 

## 2018-11-01 NOTE — ED Triage Notes (Signed)
Pt reports that he has a sore on his buttock x 4 days. Today pt reports having "chills."

## 2018-11-01 NOTE — Discharge Instructions (Signed)
It was my pleasure taking care of you today!   It is very important that you return to the ER on Sunday for recheck.   Please take all of your antibiotics until finished!   Ibuprofen as needed for fever / mild to moderate pain. Norco only as needed for severe pain.   A stool softener may be helpful to ease pain with bowel movements.   Return to ER sooner if new / worsening symptoms develop or you have any additional concerns.

## 2018-11-01 NOTE — ED Notes (Signed)
Pt also requesting TDaP

## 2018-11-03 ENCOUNTER — Other Ambulatory Visit: Payer: Self-pay

## 2018-11-03 ENCOUNTER — Emergency Department (HOSPITAL_BASED_OUTPATIENT_CLINIC_OR_DEPARTMENT_OTHER)
Admission: EM | Admit: 2018-11-03 | Discharge: 2018-11-03 | Disposition: A | Discharge status: Home / Self Care | Payer: BLUE CROSS/BLUE SHIELD | Attending: Emergency Medicine | Admitting: Emergency Medicine

## 2018-11-03 ENCOUNTER — Encounter (HOSPITAL_BASED_OUTPATIENT_CLINIC_OR_DEPARTMENT_OTHER): Payer: Self-pay | Admitting: Emergency Medicine

## 2018-11-03 DIAGNOSIS — Z79899 Other long term (current) drug therapy: Secondary | ICD-10-CM | POA: Diagnosis not present

## 2018-11-03 DIAGNOSIS — F17200 Nicotine dependence, unspecified, uncomplicated: Secondary | ICD-10-CM | POA: Insufficient documentation

## 2018-11-03 DIAGNOSIS — K611 Rectal abscess: Secondary | ICD-10-CM | POA: Insufficient documentation

## 2018-11-03 DIAGNOSIS — Z4801 Encounter for change or removal of surgical wound dressing: Secondary | ICD-10-CM | POA: Diagnosis present

## 2018-11-03 DIAGNOSIS — Z5189 Encounter for other specified aftercare: Secondary | ICD-10-CM

## 2018-11-03 NOTE — ED Triage Notes (Signed)
Reports to ER for wound recheck to buttock.

## 2018-11-03 NOTE — Discharge Instructions (Signed)
Your wound is healing well. Your wound culture showed bacteria typical from your GI tract. You can stop taking bactrim but please finish taking keflex.   You should do sitz bath 3 times daily for 3-4 days to help the area heal   See your doctor  Return to ER if you have worse pain, swelling, fever.

## 2018-11-03 NOTE — ED Provider Notes (Signed)
[facility] EMERGENCY DEPARTMENT Provider Note   CSN: [id number] Arrival date & time: 11/03/18  1703    History   Chief Complaint Chief Complaint  Patient presents with  . Wound Check    HPI Roy Phillips is a 28 y.o. male here presenting with wound check.  Patient was seen here 2 days ago with a left perirectal abscess on CT scan.  Patient had bedside I&D performed and packing was placed.  Patient also was started on Keflex and Bactrim and was told to come back in 2 days.  He states that he feels fine and has no more fevers.  He has been taking his antibiotics as prescribed.  His wound culture came back to be gram-negative rods.  Patient states that the string began to come out when he took a shower just prior to arrival.      The history is provided by the patient.    History reviewed. No pertinent past medical history.  There are no active problems to display for this patient.   Past Surgical History:  Procedure Laterality Date  . WISDOM TOOTH EXTRACTION          Home Medications    Prior to Admission medications   Medication Sig Start Date End Date Taking? Authorizing Provider  albuterol (PROVENTIL HFA;VENTOLIN HFA) 108 (90 Base) MCG/ACT inhaler Inhale 2 puffs into the lungs every 4 (four) hours as needed for wheezing or shortness of breath. 09/24/18   Molpus, John, MD  cephALEXin (KEFLEX) 500 MG capsule Take 1 capsule (500 mg total) by mouth 4 (four) times daily. 11/01/18   Ward, Chase Picket, PA-C  Dextromethorphan-Guaifenesin 60-1200 MG 12hr tablet Take 1 tablet twice daily as needed for cough. 09/24/18   Molpus, John, MD  HYDROcodone-acetaminophen (NORCO/VICODIN) 5-325 MG tablet Take 1 tablet by mouth every 6 (six) hours as needed for severe pain. 11/01/18   Ward, Chase Picket, PA-C  ibuprofen (ADVIL) 800 MG tablet Take 1 tablet (800 mg total) by mouth 3 (three) times daily. 11/01/18   Ward, Chase Picket, PA-C  sulfamethoxazole-trimethoprim (BACTRIM DS)  800-160 MG tablet Take 1 tablet by mouth 2 (two) times daily for 7 days. 11/01/18 11/08/18  Ward, Chase Picket, PA-C    Family History History reviewed. No pertinent family history.  Social History Social History   Tobacco Use  . Smoking status: Current Every Day Smoker  . Smokeless tobacco: Never Used  Substance Use Topics  . Alcohol use: Yes  . Drug use: Yes    Types: Marijuana, Cocaine    Comment: last use 10/31/18     Allergies   Patient has no known allergies.   Review of Systems Review of Systems  Gastrointestinal: Positive for rectal pain.  All other systems reviewed and are negative.    Physical Exam Updated Vital Signs BP (!) 146/104 (BP Location: Left Arm)   Pulse (!) 103   Temp (!) 97.3 F (36.3 C) (Oral)   Resp 16   Ht 6\' 1"  (1.854 m)   Wt 104.3 kg   SpO2 100%   BMI 30.34 kg/m   Physical Exam Vitals signs and nursing note reviewed.  HENT:     Head: Normocephalic.     Nose: Nose normal.     Mouth/Throat:     Mouth: Mucous membranes are moist.  Eyes:     Extraocular Movements: Extraocular movements intact.     Pupils: Pupils are equal, round, and reactive to light.  Neck:  Musculoskeletal: Normal range of motion.  Cardiovascular:     Rate and Rhythm: Normal rate.  Pulmonary:     Effort: Pulmonary effort is normal.  Abdominal:     General: Abdomen is flat.     Palpations: Abdomen is soft.  Genitourinary:    Comments: Rectal- L perianal abscess that is healing well. Packing removed and there is minimal redness, no purulent drainage  Musculoskeletal: Normal range of motion.  Skin:    General: Skin is warm.  Neurological:     General: No focal deficit present.     Mental Status: He is alert.  Psychiatric:        Mood and Affect: Mood normal.        Behavior: Behavior normal.      ED Treatments / Results  Labs (all labs ordered are listed, but only abnormal results are displayed) Labs Reviewed - No data to display  EKG None   Radiology Dg Chest 2 View  Result Date: 11/01/2018 CLINICAL DATA:  Chills, fever EXAM: CHEST - 2 VIEW COMPARISON:  10/09/2018 FINDINGS: The heart size and mediastinal contours are within normal limits. Both lungs are clear. The visualized skeletal structures are unremarkable. IMPRESSION: No active cardiopulmonary disease. Electronically Signed   By: Elige KoHetal  Patel   On: 11/01/2018 21:48   Ct Pelvis W Contrast  Result Date: 11/01/2018 CLINICAL DATA:  Left buttock abscess.  Pain and fever. EXAM: CT PELVIS WITH CONTRAST TECHNIQUE: Multidetector CT imaging of the pelvis was performed using the standard protocol following the bolus administration of intravenous contrast. CONTRAST:  100mL OMNIPAQUE IOHEXOL 300 MG/ML  SOLN COMPARISON:  None. FINDINGS: Urinary Tract:  No abnormality visualized. Bowel:  Unremarkable visualized pelvic bowel loops. Vascular/Lymphatic: No pathologically enlarged lymph nodes. No significant vascular abnormality seen. Reproductive:  No mass or other significant abnormality Other: 3.4 x 1.8 x 3.4 cm complex fluid collection in the subcutaneous fat in the left aspect of the perineum adjacent to the anus with surrounding inflammatory changes most consistent with an abscess. Musculoskeletal: No acute osseous abnormality. No aggressive osseous lesion. IMPRESSION: 1. 3.4 x 1.8 x 3.4 cm complex fluid collection in the subcutaneous fat in the left aspect of the perineum adjacent to the anus with surrounding inflammatory changes most consistent with an abscess. Electronically Signed   By: Elige KoHetal  Patel   On: 11/01/2018 21:48    Procedures Procedures (including critical care time)  The wound is cleansed, debrided of foreign material as much as possible, and dressed. The patient is alerted to watch for any signs of infection (redness, pus, pain, increased swelling or fever) and call if such occurs. I removed packing. Home wound care instructions are provided.   Medications Ordered in ED  Medications - No data to display   Initial Impression / Assessment and Plan / ED Course  I have reviewed the triage vital signs and the nursing notes.  Pertinent labs & imaging results that were available during my care of the patient were reviewed by me and considered in my medical decision making (see chart for details).       Sheliah Mendsaron T Dang is a 28 y.o. male presenting with wound check.  The left peri-rectal abscess appears well.  The packing was removed and there is no further purulent drainage.  I do not think we need to repacked the wound and since the wound culture showed gram-negative rods, I will discontinue Bactrim.  Patient is stable for discharge and I gave him strict return precautions.  Final Clinical Impressions(s) / ED Diagnoses   Final diagnoses:  Visit for wound check    ED Discharge Orders    None       Charlynne Pander, MD 11/03/18 1735

## 2018-11-04 LAB — AEROBIC CULTURE W GRAM STAIN (SUPERFICIAL SPECIMEN)

## 2018-11-06 LAB — CULTURE, BLOOD (ROUTINE X 2)
Culture: NO GROWTH
Culture: NO GROWTH
Special Requests: ADEQUATE
Special Requests: ADEQUATE

## 2019-07-08 DIAGNOSIS — Z1159 Encounter for screening for other viral diseases: Secondary | ICD-10-CM | POA: Diagnosis not present

## 2021-02-04 ENCOUNTER — Emergency Department (HOSPITAL_BASED_OUTPATIENT_CLINIC_OR_DEPARTMENT_OTHER)
Admission: EM | Admit: 2021-02-04 | Discharge: 2021-02-05 | Disposition: A | Discharge status: Home / Self Care | Payer: BLUE CROSS/BLUE SHIELD | Attending: Emergency Medicine | Admitting: Emergency Medicine

## 2021-02-04 ENCOUNTER — Encounter (HOSPITAL_BASED_OUTPATIENT_CLINIC_OR_DEPARTMENT_OTHER): Payer: Self-pay | Admitting: Emergency Medicine

## 2021-02-04 ENCOUNTER — Other Ambulatory Visit: Payer: Self-pay

## 2021-02-04 DIAGNOSIS — K611 Rectal abscess: Secondary | ICD-10-CM | POA: Insufficient documentation

## 2021-02-04 DIAGNOSIS — D72829 Elevated white blood cell count, unspecified: Secondary | ICD-10-CM | POA: Diagnosis not present

## 2021-02-04 DIAGNOSIS — F17200 Nicotine dependence, unspecified, uncomplicated: Secondary | ICD-10-CM | POA: Diagnosis not present

## 2021-02-04 MED ORDER — KETOROLAC TROMETHAMINE 15 MG/ML IJ SOLN
15.0000 mg | Freq: Once | INTRAMUSCULAR | Status: AC
  Administered 2021-02-05: 15 mg via INTRAVENOUS
  Filled 2021-02-04: qty 1

## 2021-02-04 MED ORDER — MORPHINE SULFATE (PF) 4 MG/ML IV SOLN
4.0000 mg | Freq: Once | INTRAVENOUS | Status: AC
  Administered 2021-02-05: 4 mg via INTRAVENOUS
  Filled 2021-02-04: qty 1

## 2021-02-04 MED ORDER — SODIUM CHLORIDE 0.9 % IV BOLUS
1000.0000 mL | Freq: Once | INTRAVENOUS | Status: AC
  Administered 2021-02-05: 1000 mL via INTRAVENOUS

## 2021-02-04 NOTE — ED Triage Notes (Signed)
Patient presents with complaints of perirectal abscess; states onset 2 days ago; denies any drainage; states fever this evening. States took tylenol pta.

## 2021-02-04 NOTE — ED Provider Notes (Signed)
[facility]ERGENCY DEPT [facility] Emergency Department Provider Note MRN:  [id number]  Arrival date & time: 02/05/21     Chief Complaint   Abscess   History of Present Illness   Roy Phillips is a 30 y.o. year-old male with no pertinent past medical history presenting to the ED with chief complaint of rectal abscess.  Worsening rectal pain and pain to the perineum over the past 2 days, endorsing fever today as well.  Had a similar situation 2 years ago and had to have a perianal abscess drained.  Denies any abdominal pain, no chest pain, no other complaints.  Pain is moderate to severe, constant, worse with motion or palpation.  Review of Systems  A complete 10 system review of systems was obtained and all systems are negative except as noted in the HPI and PMH.   Patient's Health History   History reviewed. No pertinent past medical history.  Past Surgical History:  Procedure Laterality Date   WISDOM TOOTH EXTRACTION      No family history on file.  Social History   Socioeconomic History   Marital status: Single    Spouse name: Not on file   Number of children: Not on file   Years of education: Not on file   Highest education level: Not on file  Occupational History   Not on file  Tobacco Use   Smoking status: Every Day   Smokeless tobacco: Never  Vaping Use   Vaping Use: Never used  Substance and Sexual Activity   Alcohol use: Yes   Drug use: Yes    Types: Marijuana, Cocaine    Comment: last use 10/31/18   Sexual activity: Not on file  Other Topics Concern   Not on file  Social History Narrative   Not on file   Social Determinants of Health   Financial Resource Strain: Not on file  Food Insecurity: Not on file  Transportation Needs: Not on file  Physical Activity: Not on file  Stress: Not on file  Social Connections: Not on file  Intimate Partner Violence: Not on file     Physical Exam   Vitals:   02/05/21 0003 02/05/21 0145  BP: (!)  153/92 (!) 163/91  Pulse: 99 90  Resp: 19 17  Temp:    SpO2: 100% 100%    CONSTITUTIONAL: Well-appearing, NAD NEURO:  Alert and oriented x 3, no focal deficits EYES:  eyes equal and reactive ENT/NECK:  no LAD, no JVD CARDIO: Regular rate, well-perfused, normal S1 and S2 PULM:  CTAB no wheezing or rhonchi GI/GU:  normal bowel sounds, non-distended, non-tender MSK/SPINE:  No gross deformities, no edema SKIN:  no rash, atraumatic PSYCH:  Appropriate speech and behavior  *Additional and/or pertinent findings included in MDM below  Diagnostic and Interventional Summary    EKG Interpretation  Date/Time:    Ventricular Rate:    PR Interval:    QRS Duration:   QT Interval:    QTC Calculation:   R Axis:     Text Interpretation:         Labs Reviewed  CBC - Abnormal; Notable for the following components:      Result Value   WBC 10.8 (*)    All other components within normal limits  COMPREHENSIVE METABOLIC PANEL - Abnormal; Notable for the following components:   Potassium 3.1 (*)    CO2 21 (*)    Glucose, Bld 112 (*)    Calcium 8.4 (*)    All  other components within normal limits    CT PELVIS W CONTRAST  Final Result      Medications  ketorolac (TORADOL) 15 MG/ML injection 15 mg (15 mg Intravenous Given 02/05/21 0019)  sodium chloride 0.9 % bolus 1,000 mL (0 mLs Intravenous Stopped 02/05/21 0132)  morphine 4 MG/ML injection 4 mg (4 mg Intravenous Given 02/05/21 0019)  iohexol (OMNIPAQUE) 300 MG/ML solution 100 mL (100 mLs Intravenous Contrast Given 02/05/21 0105)  piperacillin-tazobactam (ZOSYN) IVPB 3.375 g (3.375 g Intravenous New Bag/Given 02/05/21 0143)     Procedures  /  Critical Care Ultrasound ED Soft Tissue  Date/Time: 02/05/2021 2:12 AM Performed by: Sabas Sous, MD Authorized by: Sabas Sous, MD   Procedure details:    Indications: localization of abscess     Transverse view:  Visualized   Longitudinal view:  Visualized   Images: archived      Limitations:  Body habitus Location:    Location: buttocks     Side:  Left Findings:     abscess present  ED Course and Medical Decision Making  I have reviewed the triage vital signs, the nursing notes, and pertinent available records from the EMR.  Listed above are laboratory and imaging tests that I personally ordered, reviewed, and interpreted and then considered in my medical decision making (see below for details).  Suspect perirectal abscess, awaiting CT to determine depth and/or any complicating factors.     Labs overall reassuring, minimal leukocytosis.  CT confirms perirectal abscess.  Bedside ultrasound utilized to try to get a better sense of the location of the abscess for I&D.  Very difficult to obtain and does due to patient's body habitus and depth of the abscess.  Tried multiple times with the aid of nursing staff holding back the gluteus.  Discussed case with Dr. Rayburn Ma of general surgery.  Given the recurrence and the difficulty obtaining views, appropriate option would be patient to follow-up in the office for definitive drainage.  Patient agreeable with this plan, will put on antibiotics in the interim.  Afebrile with normal vital signs here.  Appropriate for discharge.  [name]. Pilar Plate, MD [facility] Emergency Medicine [facility] [email or web address]  Final Clinical Impressions(s) / ED Diagnoses     ICD-10-CM   1. Perirectal abscess  K61.1       ED Discharge Orders          Ordered    amoxicillin-clavulanate (AUGMENTIN) 875-125 MG tablet  Every 12 hours        02/05/21 0208    oxyCODONE (ROXICODONE) 5 MG immediate release tablet  Every 4 hours PRN        02/05/21 0209             Discharge Instructions Discussed with and Provided to Patient:     Discharge Instructions      You were evaluated in the Emergency Department and after careful evaluation, we did not find any emergent condition requiring admission or further testing in  the hospital.  Symptoms seem to be due to a perirectal abscess.  Please take the Augmentin antibiotic as prescribed and follow-up with the general surgeons.  Call first thing Monday morning to schedule a specific appointment time.  Please return to the Emergency Department if you experience any worsening of your condition.  Thank you for allowing [place] to be a part of your care.         Sabas Sous, MD 02/05/21 [phone]

## 2021-02-04 NOTE — ED Triage Notes (Signed)
States started septra 2 days ago for right thumb infection.

## 2021-02-05 ENCOUNTER — Emergency Department (HOSPITAL_BASED_OUTPATIENT_CLINIC_OR_DEPARTMENT_OTHER): Payer: BLUE CROSS/BLUE SHIELD

## 2021-02-05 LAB — COMPREHENSIVE METABOLIC PANEL
ALT: 34 U/L (ref 0–44)
AST: 21 U/L (ref 15–41)
Albumin: 3.5 g/dL (ref 3.5–5.0)
Alkaline Phosphatase: 94 U/L (ref 38–126)
Anion gap: 9 (ref 5–15)
BUN: 13 mg/dL (ref 6–20)
CO2: 21 mmol/L — ABNORMAL LOW (ref 22–32)
Calcium: 8.4 mg/dL — ABNORMAL LOW (ref 8.9–10.3)
Chloride: 105 mmol/L (ref 98–111)
Creatinine, Ser: 1.18 mg/dL (ref 0.61–1.24)
GFR, Estimated: >60 mL/min (ref >60)
Glucose, Bld: 112 mg/dL — ABNORMAL HIGH (ref 70–99)
Potassium: 3.1 mmol/L — ABNORMAL LOW (ref 3.5–5.1)
Sodium: 135 mmol/L (ref 135–145)
Total Bilirubin: 0.6 mg/dL (ref 0.3–1.2)
Total Protein: 7.1 g/dL (ref 6.5–8.1)

## 2021-02-05 LAB — CBC
HCT: 41.2 % (ref 39.0–52.0)
Hemoglobin: 14.7 g/dL (ref 13.0–17.0)
MCH: 30 pg (ref 26.0–34.0)
MCHC: 35.7 g/dL (ref 30.0–36.0)
MCV: 84.1 fL (ref 80.0–100.0)
Platelets: 269 10*3/uL (ref 150–400)
RBC: 4.9 MIL/uL (ref 4.22–5.81)
RDW: 12.6 % (ref 11.5–15.5)
WBC: 10.8 10*3/uL — ABNORMAL HIGH (ref 4.0–10.5)
nRBC: 0 % (ref 0.0–0.2)

## 2021-02-05 MED ORDER — AMOXICILLIN-POT CLAVULANATE 875-125 MG PO TABS: 1.0000 | ORAL_TABLET | Freq: Two times a day (BID) | ORAL | 0 refills | Status: AC

## 2021-02-05 MED ORDER — OXYCODONE HCL 5 MG PO TABS: 5.0000 mg | ORAL_TABLET | ORAL | 0 refills | Status: AC | PRN

## 2021-02-05 MED ORDER — PIPERACILLIN-TAZOBACTAM 3.375 G IVPB 30 MIN
3.3750 g | Freq: Once | INTRAVENOUS | Status: AC
  Administered 2021-02-05: 3.375 g via INTRAVENOUS
  Filled 2021-02-05: qty 50

## 2021-02-05 MED ORDER — IOHEXOL 300 MG/ML  SOLN
100.0000 mL | Freq: Once | INTRAMUSCULAR | Status: AC | PRN
  Administered 2021-02-05: 100 mL via INTRAVENOUS

## 2021-02-05 NOTE — ED Notes (Signed)
EDP at bedside  

## 2021-02-05 NOTE — Discharge Instructions (Addendum)
You were evaluated in the Emergency Department and after careful evaluation, we did not find any emergent condition requiring admission or further testing in the hospital.  Symptoms seem to be due to a perirectal abscess.  Please take the Augmentin antibiotic as prescribed and follow-up with the general surgeons.  Call first thing Monday morning to schedule a specific appointment time.  Please return to the Emergency Department if you experience any worsening of your condition.  Thank you for allowing [place] to be a part of your care.

## 2022-01-31 ENCOUNTER — Encounter (HOSPITAL_BASED_OUTPATIENT_CLINIC_OR_DEPARTMENT_OTHER): Payer: Self-pay | Admitting: Emergency Medicine

## 2022-01-31 ENCOUNTER — Other Ambulatory Visit: Payer: Self-pay

## 2022-01-31 DIAGNOSIS — L02415 Cutaneous abscess of right lower limb: Secondary | ICD-10-CM | POA: Insufficient documentation

## 2022-01-31 DIAGNOSIS — R21 Rash and other nonspecific skin eruption: Secondary | ICD-10-CM | POA: Diagnosis present

## 2022-01-31 NOTE — ED Triage Notes (Signed)
Pt thinks mrsa on right knee has Hx of same.

## 2022-02-01 ENCOUNTER — Telehealth (HOSPITAL_BASED_OUTPATIENT_CLINIC_OR_DEPARTMENT_OTHER): Payer: Self-pay | Admitting: Emergency Medicine

## 2022-02-01 ENCOUNTER — Emergency Department (HOSPITAL_BASED_OUTPATIENT_CLINIC_OR_DEPARTMENT_OTHER)
Admission: EM | Admit: 2022-02-01 | Discharge: 2022-02-01 | Disposition: A | Discharge status: Home / Self Care | Payer: 59 | Attending: Emergency Medicine | Admitting: Emergency Medicine

## 2022-02-01 DIAGNOSIS — L0291 Cutaneous abscess, unspecified: Secondary | ICD-10-CM

## 2022-02-01 MED ORDER — DOXYCYCLINE HYCLATE 100 MG PO CAPS: 100.0000 mg | ORAL_CAPSULE | Freq: Two times a day (BID) | ORAL | 0 refills | Status: AC

## 2022-02-01 NOTE — ED Notes (Signed)
Blistered red area around rt. 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

## 2022-02-01 NOTE — ED Provider Notes (Signed)
[facility] EMERGENCY DEPARTMENT Provider Note  CSN: [id number] Arrival date & time: 01/31/22 2147  Chief Complaint(s) Rash  HPI Roy Phillips is a 31 y.o. male    The history is provided by the patient.  Abscess Location:  Leg Leg abscess location:  R upper leg Abscess quality: draining, induration, painful and redness   Duration:  2 weeks Progression:  Worsening Pain details:    Quality:  Dull   Severity:  Moderate   Timing:  Constant Chronicity:  Recurrent Relieved by:  Nothing Risk factors: hx of MRSA and prior abscess     Past Medical History History reviewed. No pertinent past medical history. There are no problems to display for this patient.  Home Medication(s) Prior to Admission medications   Medication Sig Start Date End Date Taking? Authorizing Provider  doxycycline (VIBRAMYCIN) 100 MG capsule Take 1 capsule (100 mg total) by mouth 2 (two) times daily. 02/01/22  Yes Shelva Hetzer, Amadeo Garnet, MD  albuterol (PROVENTIL HFA;VENTOLIN HFA) 108 (90 Base) MCG/ACT inhaler Inhale 2 puffs into the lungs every 4 (four) hours as needed for wheezing or shortness of breath. 09/24/18   Molpus, John, MD  cephALEXin (KEFLEX) 500 MG capsule Take 1 capsule (500 mg total) by mouth 4 (four) times daily. 11/01/18   Ward, Chase Picket, PA-C  Dextromethorphan-Guaifenesin 60-1200 MG 12hr tablet Take 1 tablet twice daily as needed for cough. 09/24/18   Molpus, John, MD  HYDROcodone-acetaminophen (NORCO/VICODIN) 5-325 MG tablet Take 1 tablet by mouth every 6 (six) hours as needed for severe pain. 11/01/18   Ward, Chase Picket, PA-C  ibuprofen (ADVIL) 800 MG tablet Take 1 tablet (800 mg total) by mouth 3 (three) times daily. 11/01/18   Ward, Chase Picket, PA-C  oxyCODONE (ROXICODONE) 5 MG immediate release tablet Take 1 tablet (5 mg total) by mouth every 4 (four) hours as needed for severe pain. 02/05/21   Sabas Sous, MD                                                                                                                                     Allergies Patient has no known allergies.  Review of Systems Review of Systems As noted in HPI  Physical Exam Vital Signs  I have reviewed the triage vital signs BP (!) 146/93 (BP Location: Left Arm)   Pulse 92   Temp 98.7 F (37.1 C) (Oral)   Resp 18   Ht 6\' 1"  (1.854 m)   Wt 113.4 kg   SpO2 100%   BMI 32.98 kg/m   Physical Exam Vitals reviewed.  Constitutional:      General: He is not in acute distress.    Appearance: He is well-developed. He is not diaphoretic.  HENT:     Head: Normocephalic and atraumatic.     Right Ear: External ear normal.     Left Ear: External ear normal.  Nose: Nose normal.     Mouth/Throat:     Mouth: Mucous membranes are moist.  Eyes:     General: No scleral icterus.    Conjunctiva/sclera: Conjunctivae normal.  Neck:     Trachea: Phonation normal.  Cardiovascular:     Rate and Rhythm: Normal rate and regular rhythm.  Pulmonary:     Effort: Pulmonary effort is normal. No respiratory distress.     Breath sounds: No stridor.  Abdominal:     General: There is no distension.  Musculoskeletal:        General: Normal range of motion.     Cervical back: Normal range of motion.       Legs:     Comments: Approx 4x3 cm erythematous area with central pustule (drained) with underlying induration.  Neurological:     Mental Status: He is alert and oriented to person, place, and time.  Psychiatric:        Behavior: Behavior normal.     ED Results and Treatments Labs (all labs ordered are listed, but only abnormal results are displayed) Labs Reviewed - No data to display                                                                                                                       EKG  EKG Interpretation  Date/Time:    Ventricular Rate:    PR Interval:    QRS Duration:   QT Interval:    QTC Calculation:   R Axis:     Text Interpretation:          Radiology No results found.  Pertinent labs & imaging results that were available during my care of the patient were reviewed by me and considered in my medical decision making (see MDM for details).  Medications Ordered in ED Medications - No data to display                                                                                                                                   Procedures Procedures  (including critical care time)  Medical Decision Making / ED Course    Complexity of Problem:  Co-morbidities/SDOH that complicate the patient evaluation/care: Note in HPI  Patient's presenting problem/concern, DDX, and MDM listed below: Right leg abscess Small, already draining. With cellulitis. 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 Does not involve joint that would be concerning for septic  joint.   ED Course:    Assessment, Add'l Intervention, and Reassessment: Right leg abscess No need for I&D Will Rx Doxy    Final Clinical Impression(s) / ED Diagnoses Final diagnoses:  Abscess   The patient appears reasonably screened and/or stabilized for discharge and I doubt any other medical condition or other [facility] requiring further screening, evaluation, or treatment in the ED at this time prior to discharge. Safe for discharge with strict return precautions.  Disposition: Discharge  Condition: Good  I have discussed the results, Dx and Tx plan with the patient/family who expressed understanding and agree(s) with the plan. Discharge instructions discussed at length. The patient/family was given strict return precautions who verbalized understanding of the instructions. No further questions at time of discharge.    ED Discharge Orders          Ordered    doxycycline (VIBRAMYCIN) 100 MG capsule  2 times daily        02/01/22 0220            Follow Up: Patient, No Pcp Per  Call  to schedule an appointment for close follow up           This  chart was dictated using voice recognition software.  Despite best efforts to proofread,  errors can occur which can change the documentation meaning.    Fatima Blank, MD 02/01/22 [phone]

## 2023-06-18 IMAGING — CT CT PELVIS W/ CM
2 of 3 series · 17 of 46 positions shown, 19 images · IV contrast (omnipaque)
Comparison: November 01, 2018

CLINICAL DATA: Possible perirectal abscess.

EXAM:
CT PELVIS WITH CONTRAST
TECHNIQUE: Multidetector CT imaging of the pelvis was performed using the
standard protocol following the bolus administration of intravenous
contrast.
CONTRAST:  100mL OMNIPAQUE IOHEXOL 300 MG/ML  SOLN

[Series 2: axial soft · axial · 0.94mm/px · z∈[+208,+590]mm · 14 of 221 slices shown, 16 images]
[im 15/221  soft-tissue]
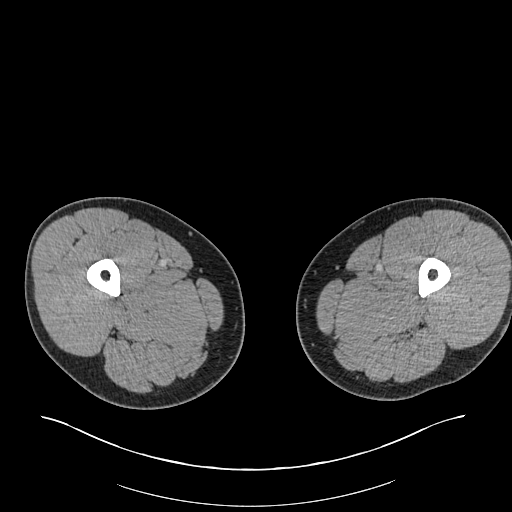
[im 15/221  bone]
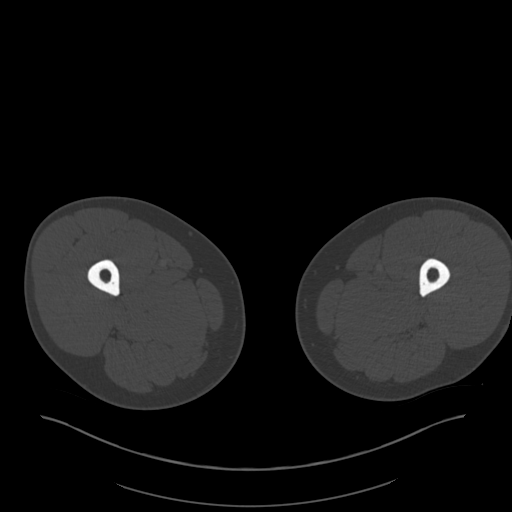
[im 29/221  soft-tissue]
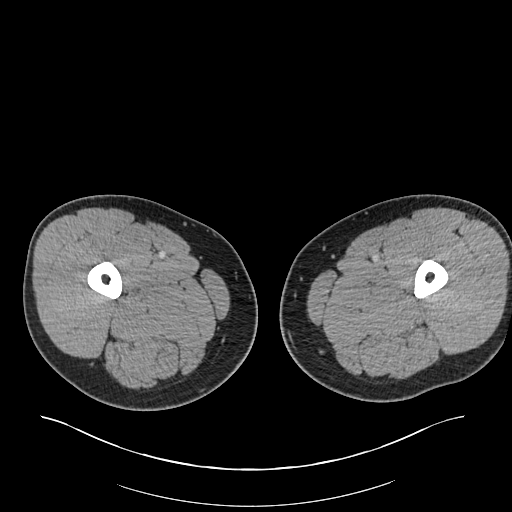
[im 43/221  soft-tissue]
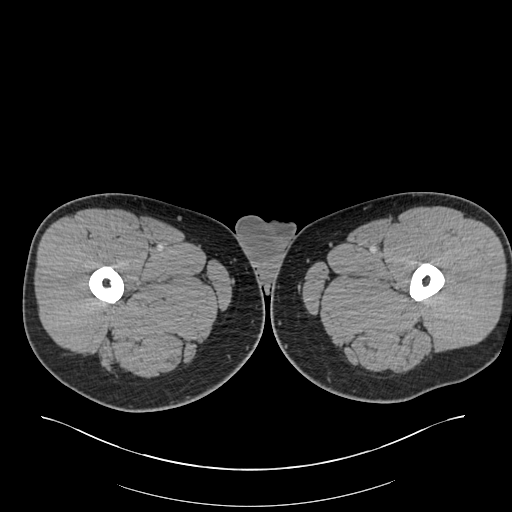
[im 57/221  soft-tissue]
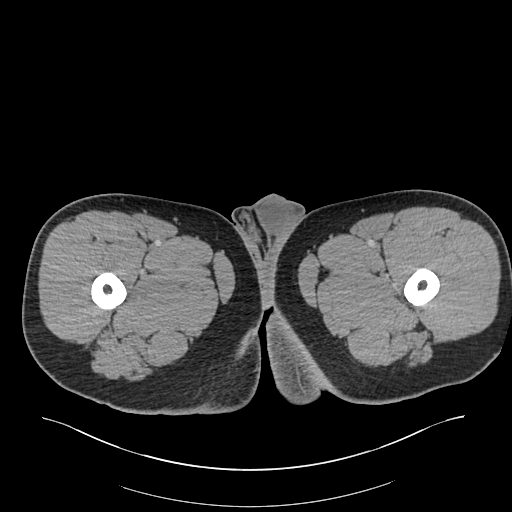
[im 71/221  soft-tissue]
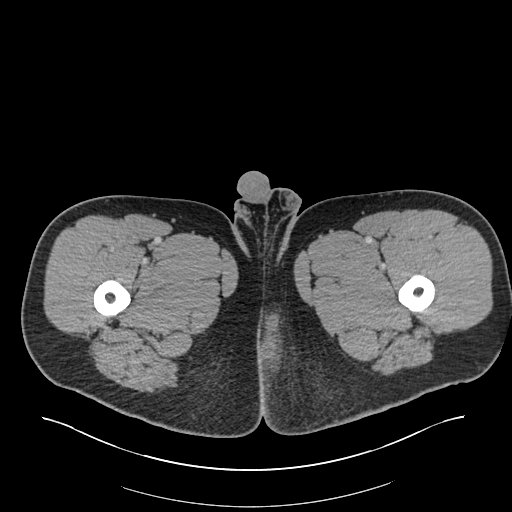
[im 86/221  soft-tissue]
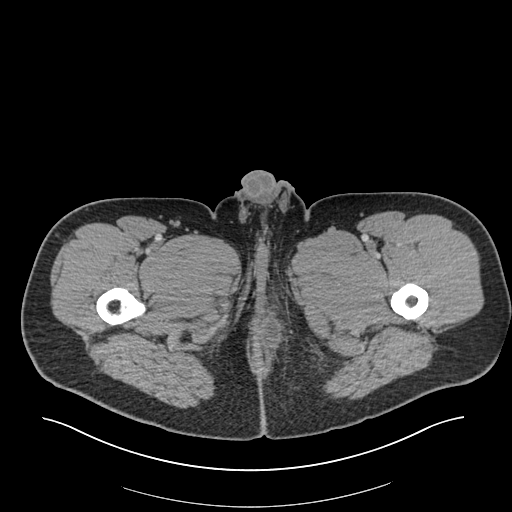
[im 100/221  soft-tissue]
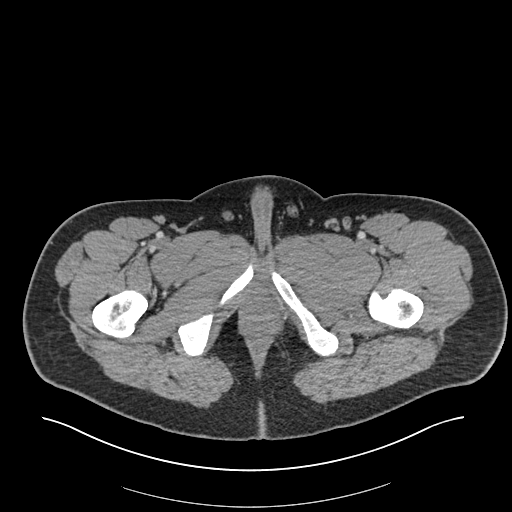
[im 121/221  soft-tissue]
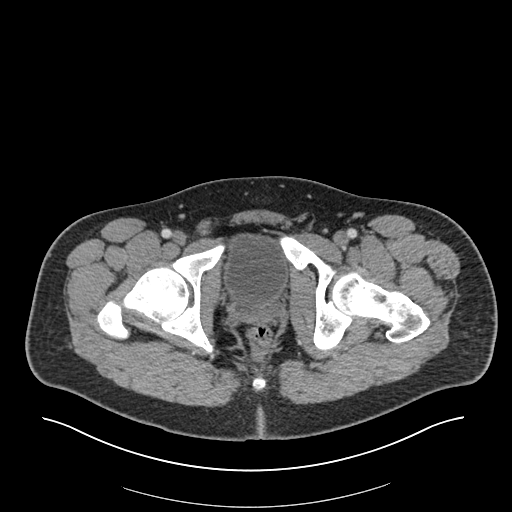
[im 135/221  soft-tissue]
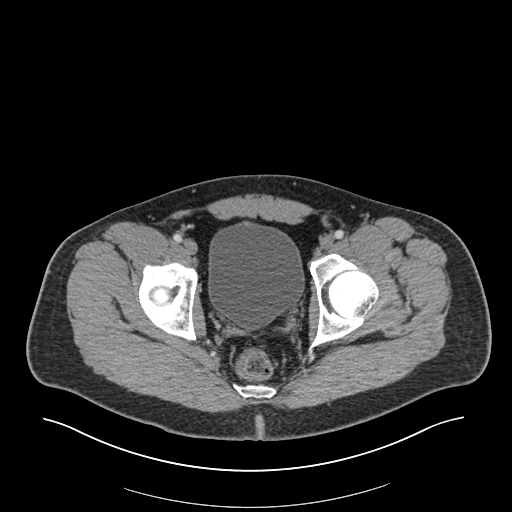
[im 135/221  bone]
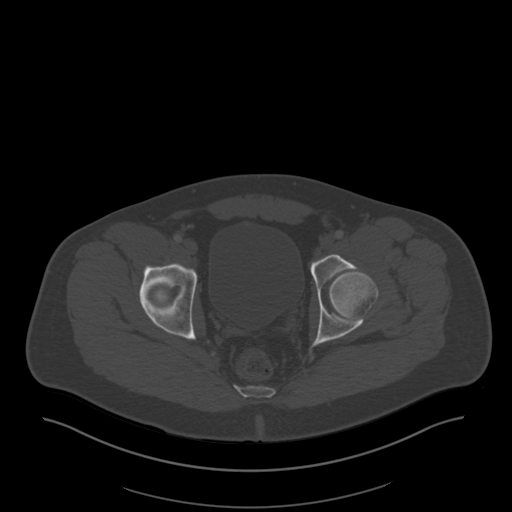
[im 150/221  soft-tissue]
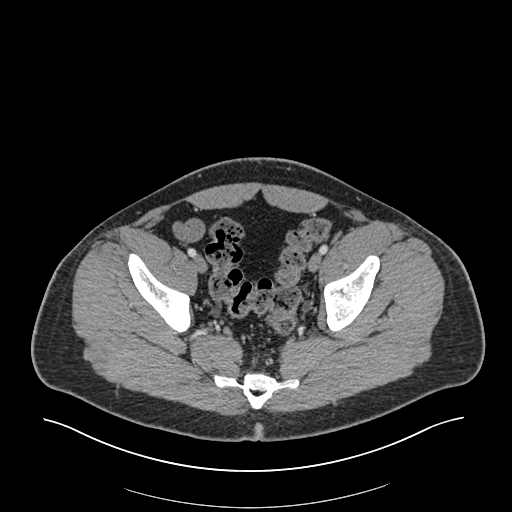
[im 164/221  soft-tissue]
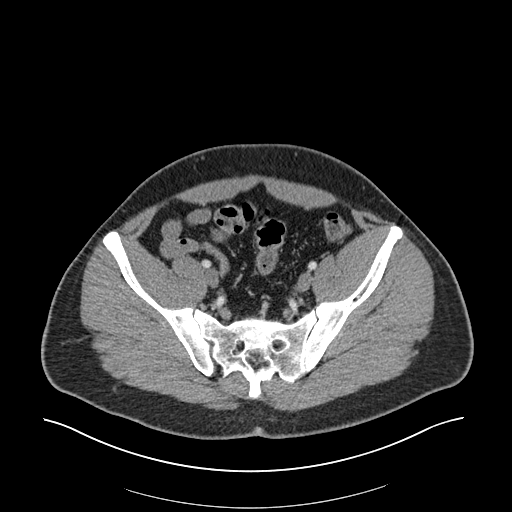
[im 178/221  soft-tissue]
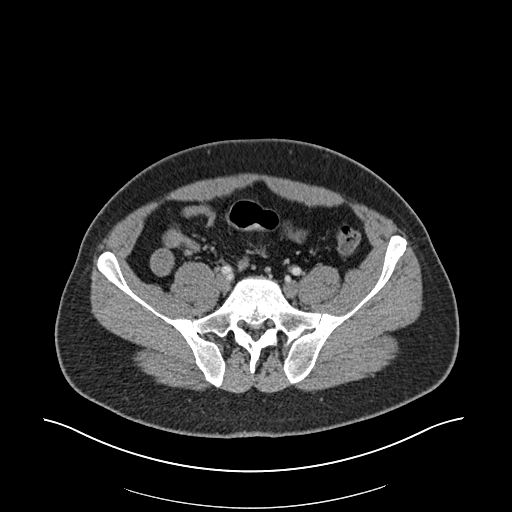
[im 192/221  soft-tissue]
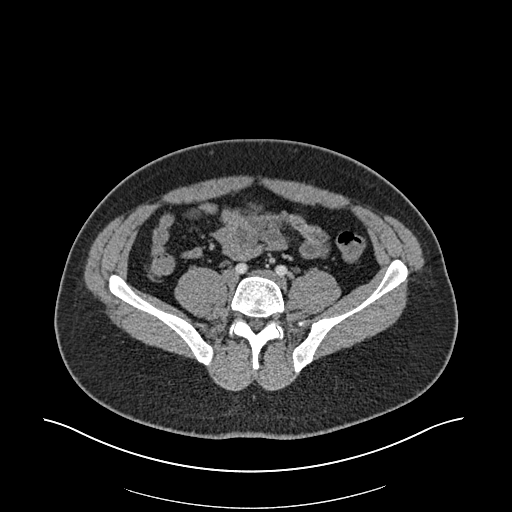
[im 206/221  soft-tissue]
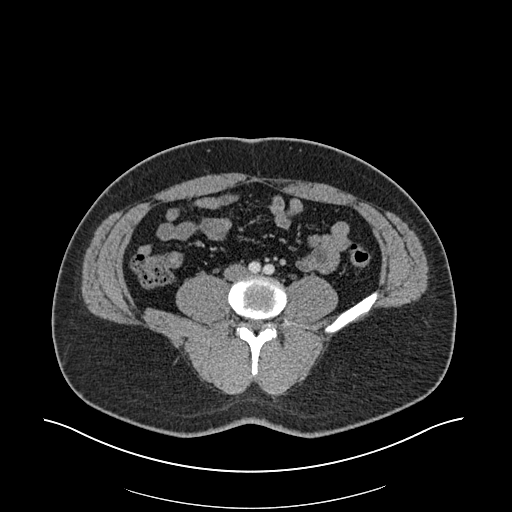

[Series 5: coronal st · coronal · 0.86mm/px · 3 of 189 slices shown]
[im 84/189  soft-tissue]
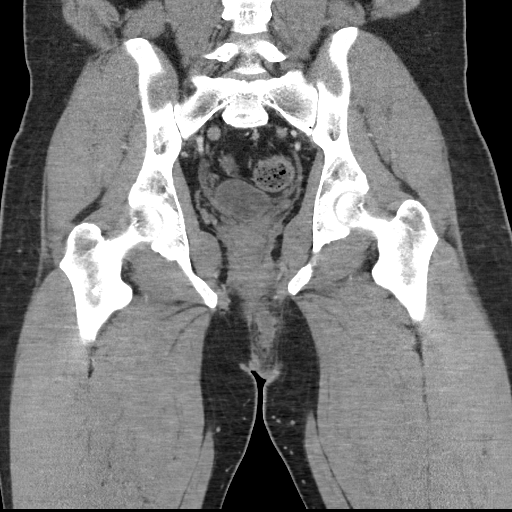
[im 105/189  soft-tissue]
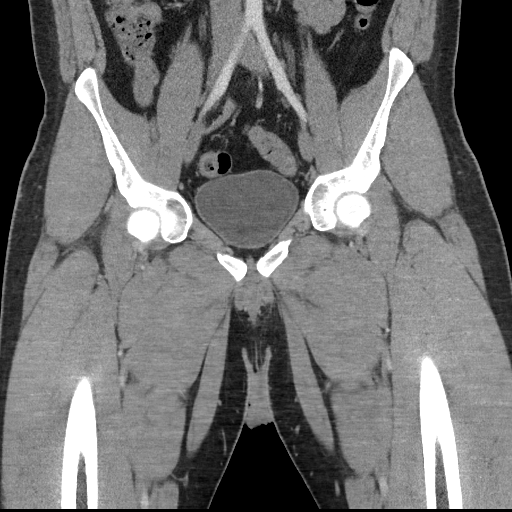
[im 126/189  soft-tissue]
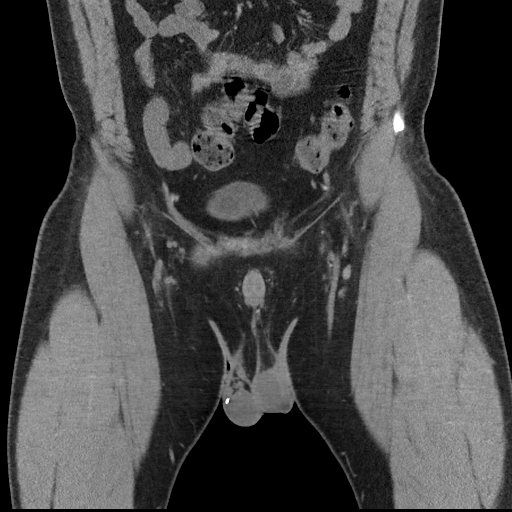

[17 of 46 positions shown; findings below may reference images not displayed]

FINDINGS: Urinary Tract:  No abnormality visualized.

Bowel:  Unremarkable visualized pelvic bowel loops.

Vascular/Lymphatic: No pathologically enlarged lymph nodes. No
significant vascular abnormality seen.

Reproductive:  No mass or other significant abnormality

Other: A 4.0 cm x 1.9 cm x 3.6 cm complex appearing area of low
attenuation (approximately 25.36 Hounsfield units) is seen within
the perirectal fat on the left. There is a thin, surrounding mildly
hyperdense wall with a moderate amount of surrounding inflammatory
fat stranding.

Musculoskeletal: No suspicious bone lesions identified.
IMPRESSION: Findings consistent with a left-sided perirectal abscess.

## 2023-07-20 ENCOUNTER — Other Ambulatory Visit: Payer: Self-pay

## 2023-07-20 ENCOUNTER — Emergency Department (HOSPITAL_BASED_OUTPATIENT_CLINIC_OR_DEPARTMENT_OTHER)
Admission: EM | Admit: 2023-07-20 | Discharge: 2023-07-21 | Disposition: A | Discharge status: Home / Self Care | Payer: 59 | Attending: Emergency Medicine | Admitting: Emergency Medicine

## 2023-07-20 ENCOUNTER — Emergency Department (HOSPITAL_BASED_OUTPATIENT_CLINIC_OR_DEPARTMENT_OTHER): Payer: 59

## 2023-07-20 ENCOUNTER — Encounter (HOSPITAL_BASED_OUTPATIENT_CLINIC_OR_DEPARTMENT_OTHER): Payer: Self-pay | Admitting: Emergency Medicine

## 2023-07-20 DIAGNOSIS — I10 Essential (primary) hypertension: Secondary | ICD-10-CM | POA: Diagnosis not present

## 2023-07-20 DIAGNOSIS — F17200 Nicotine dependence, unspecified, uncomplicated: Secondary | ICD-10-CM | POA: Diagnosis not present

## 2023-07-20 DIAGNOSIS — R000 Tachycardia, unspecified: Secondary | ICD-10-CM | POA: Insufficient documentation

## 2023-07-20 DIAGNOSIS — R1013 Epigastric pain: Secondary | ICD-10-CM | POA: Diagnosis not present

## 2023-07-20 DIAGNOSIS — R112 Nausea with vomiting, unspecified: Secondary | ICD-10-CM | POA: Diagnosis not present

## 2023-07-20 DIAGNOSIS — R197 Diarrhea, unspecified: Secondary | ICD-10-CM | POA: Insufficient documentation

## 2023-07-20 DIAGNOSIS — R109 Unspecified abdominal pain: Secondary | ICD-10-CM | POA: Diagnosis present

## 2023-07-20 HISTORY — DX: Hyperlipidemia, unspecified: E78.5

## 2023-07-20 HISTORY — DX: Essential (primary) hypertension: I10

## 2023-07-20 LAB — CBC
HCT: 50.8 % (ref 39.0–52.0)
Hemoglobin: 18 g/dL — ABNORMAL HIGH (ref 13.0–17.0)
MCH: 29.3 pg (ref 26.0–34.0)
MCHC: 35.4 g/dL (ref 30.0–36.0)
MCV: 82.6 fL (ref 80.0–100.0)
Platelets: 270 10*3/uL (ref 150–400)
RBC: 6.15 MIL/uL — ABNORMAL HIGH (ref 4.22–5.81)
RDW: 13 % (ref 11.5–15.5)
WBC: 10.5 10*3/uL (ref 4.0–10.5)
nRBC: 0 % (ref 0.0–0.2)

## 2023-07-20 MED ORDER — ONDANSETRON HCL 4 MG/2ML IJ SOLN
4.0000 mg | Freq: Once | INTRAMUSCULAR | Status: AC
Start: 2023-07-20 — End: 2023-07-20
  Administered 2023-07-20: 4 mg via INTRAVENOUS
  Filled 2023-07-20: qty 2

## 2023-07-20 MED ORDER — FENTANYL CITRATE PF 50 MCG/ML IJ SOSY
50.0000 ug | PREFILLED_SYRINGE | Freq: Once | INTRAMUSCULAR | Status: AC
  Administered 2023-07-20: 50 ug via INTRAVENOUS
  Filled 2023-07-20: qty 1

## 2023-07-20 MED ORDER — ACETAMINOPHEN 500 MG PO TABS
1000.0000 mg | ORAL_TABLET | Freq: Once | ORAL | Status: AC
Start: 2023-07-20 — End: 2023-07-20
  Administered 2023-07-20: 1000 mg via ORAL
  Filled 2023-07-20: qty 2

## 2023-07-20 NOTE — ED Provider Notes (Signed)
  EMERGENCY DEPARTMENT AT [facility] Provider Note   CSN: [id number] Arrival date & time: 07/20/23  2223     History  Chief Complaint  Patient presents with   Abdominal Pain    Roy Phillips is a 33 y.o. male.  The history is provided by the patient.  Abdominal Pain Roy Phillips is a 33 y.o. male who presents to the Emergency Department complaining of abdominal pain.  He presents to the emergency department for evaluation of epigastric abdominal pain that started around noon today.  He had associated 2 episodes of emesis with 4 small loose stools.  No dysuria.  No prior similar symptoms.  He has a history of elevated triglycerides, hypertension.  He does use tobacco.  Occasional alcohol.  Uses marijuana. No prior abdominal surgeries.    Home Medications Prior to Admission medications   Medication Sig Start Date End Date Taking? Authorizing Provider  ondansetron  (ZOFRAN -ODT) 4 MG disintegrating tablet Take 1 tablet (4 mg total) by mouth every 8 (eight) hours as needed. 07/21/23  Yes Griselda Norris, MD  albuterol  (PROVENTIL  HFA;VENTOLIN  HFA) 108 [phone] Base) MCG/ACT inhaler Inhale 2 puffs into the lungs every 4 (four) hours as needed for wheezing or shortness of breath. 09/24/18   Molpus, John, MD  cephALEXin  (KEFLEX ) 500 MG capsule Take 1 capsule (500 mg total) by mouth 4 (four) times daily. 11/01/18   Ward, Ami Copes, PA-C  Dextromethorphan-Guaifenesin  60-1200 MG 12hr tablet Take 1 tablet twice daily as needed for cough. 09/24/18   Molpus, John, MD  doxycycline  (VIBRAMYCIN ) 100 MG capsule Take 1 capsule (100 mg total) by mouth 2 (two) times daily. 02/01/22   Cardama, Raynell Moder, MD  HYDROcodone -acetaminophen  (NORCO/VICODIN) 5-325 MG tablet Take 1 tablet by mouth every 6 (six) hours as needed for severe pain. 11/01/18   Ward, Ami Copes, PA-C  ibuprofen  (ADVIL ) 800 MG tablet Take 1 tablet (800 mg total) by mouth 3 (three) times daily. 11/01/18   Ward, Ami Copes, PA-C  oxyCODONE  (ROXICODONE ) 5 MG immediate release tablet Take 1 tablet (5 mg total) by mouth every 4 (four) hours as needed for severe pain. 02/05/21   Bero, Michael M, MD      Allergies    Patient has no known allergies.    Review of Systems   Review of Systems  Gastrointestinal:  Positive for abdominal pain.  All other systems reviewed and are negative.   Physical Exam Updated Vital Signs BP 137/89   Pulse (!) 104   Temp 98.6 F (37 C) (Oral)   Resp 18   Ht 6' 1 (1.854 m)   Wt 122.5 kg   SpO2 100%   BMI 35.62 kg/m  Physical Exam Vitals and nursing note reviewed.  Constitutional:      Appearance: He is well-developed.  HENT:     Head: Normocephalic and atraumatic.  Cardiovascular:     Rate and Rhythm: Regular rhythm. Tachycardia present.  Pulmonary:     Effort: Pulmonary effort is normal. No respiratory distress.  Abdominal:     Palpations: Abdomen is soft.     Tenderness: There is no guarding or rebound.     Comments: Mild upper abdominal tenderness  Musculoskeletal:        General: No tenderness.  Skin:    General: Skin is warm and dry.  Neurological:     Mental Status: He is alert and oriented to person, place, and time.  Psychiatric:  Behavior: Behavior normal.     ED Results / Procedures / Treatments   Labs (all labs ordered are listed, but only abnormal results are displayed) Labs Reviewed  COMPREHENSIVE METABOLIC PANEL - Abnormal; Notable for the following components:      Result Value   Potassium 3.4 (*)    CO2 21 (*)    Glucose, Bld 136 (*)    Calcium 8.8 (*)    AST 46 (*)    ALT 83 (*)    Total Bilirubin 1.8 (*)    All other components within normal limits  CBC - Abnormal; Notable for the following components:   RBC 6.15 (*)    Hemoglobin 18.0 (*)    All other components within normal limits  LACTIC ACID, PLASMA - Abnormal; Notable for the following components:   Lactic Acid, Venous 2.7 (*)    All other components  within normal limits  URINALYSIS, W/ REFLEX TO CULTURE (INFECTION SUSPECTED) - Abnormal; Notable for the following components:   Protein, ur 30 (*)    Bacteria, UA RARE (*)    All other components within normal limits  CULTURE, BLOOD (ROUTINE X 2)  CULTURE, BLOOD (ROUTINE X 2)  LIPASE, BLOOD  LACTIC ACID, PLASMA    EKG EKG Interpretation Date/Time:  Friday July 20 2023 22:52:34 EST Ventricular Rate:  128 PR Interval:  160 QRS Duration:  84 QT Interval:  286 QTC Calculation: 418 R Axis:   66  Text Interpretation: Sinus tachycardia Low voltage, precordial leads Confirmed by Griselda Norris [phone]) on 07/20/2023 10:58:52 PM  Radiology CT ABDOMEN PELVIS W CONTRAST Result Date: 07/21/2023 CLINICAL DATA:  Acute, nonlocalized abdominal pain. Periumbilical pain EXAM: CT ABDOMEN AND PELVIS WITH CONTRAST TECHNIQUE: Multidetector CT imaging of the abdomen and pelvis was performed using the standard protocol following bolus administration of intravenous contrast. RADIATION DOSE REDUCTION: This exam was performed according to the departmental dose-optimization program which includes automated exposure control, adjustment of the mA and/or kV according to patient size and/or use of iterative reconstruction technique. CONTRAST:  OMNIPAQUE  IOHEXOL  300 MG/ML  SOLN COMPARISON:  None Available. FINDINGS: Lower chest:  No contributory findings. Hepatobiliary: No focal liver abnormality. No evidence of biliary obstruction or stone. Pancreas: Unremarkable. Spleen: Unremarkable. Adrenals/Urinary Tract: Negative adrenals. No hydronephrosis or stone. Unremarkable bladder. Stomach/Bowel:  No obstruction. No appendicitis. Vascular/Lymphatic: No acute vascular abnormality. No mass or adenopathy. Reproductive:No pathologic findings. Other: No ascites or pneumoperitoneum. Musculoskeletal: No acute abnormalities. IMPRESSION: No acute finding.  Negative for appendicitis. Electronically Signed   By: Dorn Roulette  M.D.   On: 07/21/2023 04:21   DG Chest 2 View Result Date: 07/20/2023 CLINICAL DATA:  Nausea and vomiting with abdominal pain, initial encounter EXAM: CHEST - 2 VIEW COMPARISON:  11/01/2018 FINDINGS: The heart size and mediastinal contours are within normal limits. Both lungs are clear. The visualized skeletal structures are unremarkable. IMPRESSION: No active cardiopulmonary disease. Electronically Signed   By: Oneil Devonshire M.D.   On: 07/20/2023 23:52    Procedures Procedures    Medications Ordered in ED Medications  ondansetron  (ZOFRAN ) injection 4 mg (4 mg Intravenous Given 07/20/23 2339)  acetaminophen  (TYLENOL ) tablet 1,000 mg (1,000 mg Oral Given 07/20/23 2339)  fentaNYL  (SUBLIMAZE ) injection 50 mcg (50 mcg Intravenous Given 07/20/23 2340)  sodium chloride  0.9 % bolus 1,000 mL (0 mLs Intravenous Stopped 07/21/23 0153)  pantoprazole  (PROTONIX ) injection 40 mg (40 mg Intravenous Given 07/21/23 0344)  fentaNYL  (SUBLIMAZE ) injection 50 mcg (50 mcg Intravenous Given 07/21/23 0344)  iohexol  (OMNIPAQUE ) 300 MG/ML solution 100 mL (100 mLs Intravenous Contrast Given 07/21/23 0358)  ondansetron  (ZOFRAN ) injection 4 mg (4 mg Intravenous Given 07/21/23 0453)  ibuprofen  (ADVIL ) tablet 600 mg (600 mg Oral Given 07/21/23 0453)    ED Course/ Medical Decision Making/ A&P                                 Medical Decision Making Amount and/or Complexity of Data Reviewed Labs: ordered. Radiology: ordered.  Risk OTC drugs. Prescription drug management.   Patient here for evaluation of epigastric pain, vomiting, diarrhea.  He was found to be febrile at time of ED arrival.  He is also significantly tachycardic at time of ED arrival.  He was treated with acetaminophen , IV fluids, antiemetic as well as pain medications.  Lipase is within normal limits.  CMP with mild elevation in his transaminases, history of same but on record review.  Hemoglobin is elevated at 18, likely secondary to dehydration and  hemoconcentration.  UA is not consistent with UTI.  Initial lactic acid elevated, likely secondary to dehydration, this cleared with IV fluid administration.  Given patient's pain and symptoms a CT abdomen pelvis was obtained, which is negative for acute abnormality.  On repeat assessment he is able to tolerate p.o. in the emergency department and is feeling partially improved.  Suspect he has viral GI process.  Feel he is stable for discharge home with outpatient follow-up as well as return precautions.  Will prescribe as needed antiemetic with return precautions for progressive or concerning symptoms.        Final Clinical Impression(s) / ED Diagnoses Final diagnoses:  Epigastric pain  Nausea vomiting and diarrhea    Rx / DC Orders ED Discharge Orders          Ordered    ondansetron  (ZOFRAN -ODT) 4 MG disintegrating tablet  Every 8 hours PRN        07/21/23 0443              Griselda Norris, MD 07/21/23 [phone]

## 2023-07-20 NOTE — ED Triage Notes (Signed)
 Pt presents to the ED via POV with complaints of periumbilical abdominal pain with associated N/V that started today around lunchtime following a meal. Pt states that he was eating and felt a sharp pain and became nauseated and diaphoretic. Pt last took Tylenol  around 3 hours ago without any relief. He notes vomiting following the consumption of the medication. A&Ox4 at this time. Denies CP or SOB.

## 2023-07-20 NOTE — ED Notes (Signed)
 Patient transported to X-ray

## 2023-07-21 ENCOUNTER — Emergency Department (HOSPITAL_BASED_OUTPATIENT_CLINIC_OR_DEPARTMENT_OTHER): Payer: 59

## 2023-07-21 LAB — COMPREHENSIVE METABOLIC PANEL
ALT: 83 U/L — ABNORMAL HIGH (ref 0–44)
AST: 46 U/L — ABNORMAL HIGH (ref 15–41)
Albumin: 4.5 g/dL (ref 3.5–5.0)
Alkaline Phosphatase: 77 U/L (ref 38–126)
Anion gap: 14 (ref 5–15)
BUN: 18 mg/dL (ref 6–20)
CO2: 21 mmol/L — ABNORMAL LOW (ref 22–32)
Calcium: 8.8 mg/dL — ABNORMAL LOW (ref 8.9–10.3)
Chloride: 101 mmol/L (ref 98–111)
Creatinine, Ser: 1.16 mg/dL (ref 0.61–1.24)
GFR, Estimated: >60 mL/min (ref >60)
Glucose, Bld: 136 mg/dL — ABNORMAL HIGH (ref 70–99)
Potassium: 3.4 mmol/L — ABNORMAL LOW (ref 3.5–5.1)
Sodium: 136 mmol/L (ref 135–145)
Total Bilirubin: 1.8 mg/dL — ABNORMAL HIGH (ref 0.0–1.2)
Total Protein: 7.8 g/dL (ref 6.5–8.1)

## 2023-07-21 LAB — URINALYSIS, W/ REFLEX TO CULTURE (INFECTION SUSPECTED)
Bilirubin Urine: NEGATIVE
Glucose, UA: NEGATIVE mg/dL
Hgb urine dipstick: NEGATIVE
Ketones, ur: NEGATIVE mg/dL
Leukocytes,Ua: NEGATIVE
Nitrite: NEGATIVE
Protein, ur: 30 mg/dL — AB
RBC / HPF: NONE SEEN RBC/hpf (ref 0–5)
Specific Gravity, Urine: >=1.03 (ref 1.005–1.030)
pH: 5.5 (ref 5.0–8.0)

## 2023-07-21 LAB — LIPASE, BLOOD: Lipase: 38 U/L (ref 11–51)

## 2023-07-21 LAB — LACTIC ACID, PLASMA
Lactic Acid, Venous: 1.7 mmol/L (ref 0.5–1.9)
Lactic Acid, Venous: 2.7 mmol/L (ref 0.5–1.9)

## 2023-07-21 MED ORDER — IBUPROFEN 400 MG PO TABS
600.0000 mg | ORAL_TABLET | Freq: Once | ORAL | Status: AC
  Administered 2023-07-21: 600 mg via ORAL
  Filled 2023-07-21: qty 1

## 2023-07-21 MED ORDER — ONDANSETRON HCL 4 MG/2ML IJ SOLN
4.0000 mg | Freq: Once | INTRAMUSCULAR | Status: AC
  Administered 2023-07-21: 4 mg via INTRAVENOUS
  Filled 2023-07-21: qty 2

## 2023-07-21 MED ORDER — PANTOPRAZOLE SODIUM 40 MG IV SOLR
40.0000 mg | Freq: Once | INTRAVENOUS | Status: AC
  Administered 2023-07-21: 40 mg via INTRAVENOUS
  Filled 2023-07-21: qty 10

## 2023-07-21 MED ORDER — FENTANYL CITRATE PF 50 MCG/ML IJ SOSY
50.0000 ug | PREFILLED_SYRINGE | Freq: Once | INTRAMUSCULAR | Status: AC
  Administered 2023-07-21: 50 ug via INTRAVENOUS
  Filled 2023-07-21: qty 1

## 2023-07-21 MED ORDER — IOHEXOL 300 MG/ML  SOLN
100.0000 mL | Freq: Once | INTRAMUSCULAR | Status: AC | PRN
  Administered 2023-07-21: 100 mL via INTRAVENOUS

## 2023-07-21 MED ORDER — SODIUM CHLORIDE 0.9 % IV BOLUS
1000.0000 mL | Freq: Once | INTRAVENOUS | Status: AC
  Administered 2023-07-21: 1000 mL via INTRAVENOUS

## 2023-07-21 MED ORDER — ONDANSETRON 4 MG PO TBDP: 4.0000 mg | ORAL_TABLET | Freq: Three times a day (TID) | ORAL | 0 refills | Status: AC | PRN

## 2023-07-21 NOTE — ED Notes (Signed)
 Pt provided water for PO challenge. Tolerating at this time.

## 2023-07-21 NOTE — ED Notes (Signed)
 Patient transported to CT

## 2023-07-21 NOTE — ED Notes (Signed)
 Reviewed D/C information with the patient, pt verbalized understanding. No additional concerns at this time.

## 2023-07-26 LAB — CULTURE, BLOOD (ROUTINE X 2)
Culture: NO GROWTH
Culture: NO GROWTH
Special Requests: ADEQUATE
Special Requests: ADEQUATE
